# Patient Record
Sex: Female | Born: 1960 | Race: Black or African American | Hispanic: No | Smoking: Never smoker
Health system: Southern US, Community
[De-identification: ages and names within clinical notes are randomized; demographics above are authoritative.]

## PROBLEM LIST (undated history)

## (undated) DIAGNOSIS — I10 Essential (primary) hypertension: Secondary | ICD-10-CM

## (undated) DIAGNOSIS — F32A Depression, unspecified: Secondary | ICD-10-CM

## (undated) DIAGNOSIS — F329 Major depressive disorder, single episode, unspecified: Secondary | ICD-10-CM

## (undated) DIAGNOSIS — E669 Obesity, unspecified: Secondary | ICD-10-CM

## (undated) DIAGNOSIS — E119 Type 2 diabetes mellitus without complications: Secondary | ICD-10-CM

## (undated) HISTORY — DX: Essential (primary) hypertension: I10

## (undated) HISTORY — DX: Type 2 diabetes mellitus without complications: E11.9

---

## 2007-06-15 ENCOUNTER — Emergency Department: Payer: Self-pay | Admitting: Emergency Medicine

## 2008-08-05 ENCOUNTER — Observation Stay: Payer: Self-pay | Admitting: Internal Medicine

## 2010-12-21 ENCOUNTER — Emergency Department: Payer: Self-pay | Admitting: Unknown Physician Specialty

## 2010-12-24 ENCOUNTER — Emergency Department: Payer: Self-pay | Admitting: Emergency Medicine

## 2012-05-03 ENCOUNTER — Ambulatory Visit: Payer: Self-pay

## 2013-03-08 ENCOUNTER — Ambulatory Visit: Payer: Self-pay | Admitting: Internal Medicine

## 2013-03-22 ENCOUNTER — Ambulatory Visit: Payer: Self-pay | Admitting: Vascular Surgery

## 2013-06-09 ENCOUNTER — Ambulatory Visit (INDEPENDENT_AMBULATORY_CARE_PROVIDER_SITE_OTHER): Payer: BC Managed Care – PPO | Admitting: Podiatry

## 2013-06-09 ENCOUNTER — Encounter: Payer: Self-pay | Admitting: Podiatry

## 2013-06-09 VITALS — BP 156/100 | HR 106 | Resp 18 | Ht 65.0 in | Wt 274.0 lb

## 2013-06-09 DIAGNOSIS — L600 Ingrowing nail: Secondary | ICD-10-CM

## 2013-06-09 NOTE — Patient Instructions (Signed)

## 2013-06-09 NOTE — Progress Notes (Signed)
   Subjective:    Patient ID: Lynn [name], female    DOB: 07/11/1960, 53 y.o.   MRN: [id number]  HPI Comments: Took a shower over last weekend and got out of shower and was clipping my toenails and realized that the left big toenail was coming off, do not remember any injury to the toe. It was hurting some yesterday , i took some tylenol and have kept it wrapped up in tape .  Pt is diabetic with the last a1c of 7.2      Review of Systems  Respiratory: Positive for shortness of breath.   Cardiovascular: Positive for leg swelling.  Endocrine:       Diabetic  Musculoskeletal:       Joint pain   Skin:       Change in nails   All other systems reviewed and are negative.       Objective:   Physical Exam        Assessment & Plan:

## 2013-06-11 NOTE — Progress Notes (Signed)
Subjective:     Patient ID: Lynn [name], female   DOB: 10/26/1960, 53 y.o.   MRN: [id number]  HPI patient presents with a severely thickened left hallux nail that is loose and painful on the dorsal surface. States that she cannot cut it and it has just are growing to the side and that is becoming painful   Review of Systems  All other systems reviewed and are negative.       Objective:   Physical Exam  Nursing note and vitals reviewed. Constitutional: She is oriented to person, place, and time.  Cardiovascular: Intact distal pulses.   Musculoskeletal: Normal range of motion.  Neurological: She is oriented to person, place, and time.  Skin: Skin is warm.   neurovascular status is intact with normal muscle strength and no equinus condition noted. Patient's sugar is stable with a A1 C. 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 I have recommended removal of this nail as I think it will be in her best interest and I explained risk of surgery and advantages. Patient wants procedure and today I infiltrated 60 mg Xylocaine Marcaine mixture remove the hallux nail exposed the matrix and applied chemical for applications of phenol followed by alcohol lavaged and sterile dressing instructed on soaks and reappoint as needed

## 2013-06-15 ENCOUNTER — Encounter: Payer: Self-pay | Admitting: Podiatrist

## 2013-06-15 ENCOUNTER — Ambulatory Visit: Payer: BC Managed Care – PPO | Admitting: Podiatrist

## 2013-06-15 VITALS — BP 190/118 | HR 93 | Resp 16 | Ht 64.0 in | Wt 272.0 lb

## 2013-06-15 DIAGNOSIS — L600 Ingrowing nail: Secondary | ICD-10-CM

## 2013-06-15 DIAGNOSIS — M79609 Pain in unspecified limb: Secondary | ICD-10-CM

## 2013-06-15 MED ORDER — CEPHALEXIN 500 MG PO CAPS: 500.0000 mg | ORAL_CAPSULE | Freq: Three times a day (TID) | ORAL | Status: DC

## 2013-06-15 NOTE — Progress Notes (Signed)
Subjective: Patient presents today for followup of left great toe where permanent phenol matrixectomy of the entire toenail was performed courtesy of Dr. Charlsie Merlesegal. Patient states she's been soaking in Epsom salt soaks and that the entire toe became white. She denies any redness or swelling to the toe but states that the whitish discoloration is concerning. she denies any fevers, chills, night sweats or systemic signs of infection.  Objective: Neurovascular status is intact. Left great toe has moisture to the skin on the tip of the toe in plantar aspect of the toe making it appear whitish discoloration due to the maceration. The area where the toenail was removed appears to be healing nicely. The proximal nail fold is pink and uncomfortable with pressure.  Assessment: Status post total permanent phenol matrixectomy left hallux nail  Plan: Recommended the patient's switch to Epsom salts soaks and gave her instructions for these. Instructed her to start leaving the toe open to air to allow the macerated tissue dry out. I also did put her on antibiotics to prevent infection. She will continue to wrap the toe in a gauze type dressing until she is able to utilize a Band-Aid. It does not look infected at today's visit ever if she notices any redness, swelling, pus or increase in pain she is instructed to contact us immediately.

## 2013-06-15 NOTE — Patient Instructions (Signed)
Soak Instructions    Go ahead and switch to epsom salt soaks instead of betadine   Place 1/4 cup of epsom salts in a quart of warm tap water.   soak in the solution for 20 minutes.   Next, remove your foot or feet from solution, blot dry the affected area and leave open to air for an hour.   You may use a band aid large enough to cover the area or use gauze and tape.  Apply other medications to the area as directed by the doctor such as polysporin neosporin.  IF YOUR SKIN BECOMES IRRITATED WHILE USING THESE INSTRUCTIONS, IT IS OKAY TO SWITCH TO  WHITE VINEGAR AND WATER. Or you may use antibacterial soap and water to keep the toe clean

## 2015-06-13 ENCOUNTER — Emergency Department: Payer: BLUE CROSS/BLUE SHIELD

## 2015-06-13 ENCOUNTER — Emergency Department
Admission: EM | Admit: 2015-06-13 | Discharge: 2015-06-13 | Discharge status: Against Medical Advice | Payer: BLUE CROSS/BLUE SHIELD | Attending: Emergency Medicine | Admitting: Emergency Medicine

## 2015-06-13 ENCOUNTER — Encounter: Payer: Self-pay | Admitting: *Deleted

## 2015-06-13 DIAGNOSIS — E119 Type 2 diabetes mellitus without complications: Secondary | ICD-10-CM | POA: Diagnosis not present

## 2015-06-13 DIAGNOSIS — R202 Paresthesia of skin: Secondary | ICD-10-CM | POA: Diagnosis not present

## 2015-06-13 DIAGNOSIS — R9431 Abnormal electrocardiogram [ECG] [EKG]: Secondary | ICD-10-CM | POA: Diagnosis not present

## 2015-06-13 DIAGNOSIS — I10 Essential (primary) hypertension: Secondary | ICD-10-CM | POA: Diagnosis not present

## 2015-06-13 LAB — BASIC METABOLIC PANEL
Anion gap: 7 (ref 5–15)
BUN: 13 mg/dL (ref 6–20)
CHLORIDE: 105 mmol/L (ref 101–111)
CO2: 28 mmol/L (ref 22–32)
CREATININE: 0.8 mg/dL (ref 0.44–1.00)
Calcium: 9.7 mg/dL (ref 8.9–10.3)
GFR calc Af Amer: >60 mL/min (ref >60)
GFR calc non Af Amer: >60 mL/min (ref >60)
GLUCOSE: 156 mg/dL — AB (ref 65–99)
Potassium: 3.8 mmol/L (ref 3.5–5.1)
SODIUM: 140 mmol/L (ref 135–145)

## 2015-06-13 LAB — CBC
HCT: 42 % (ref 35.0–47.0)
Hemoglobin: 13.6 g/dL (ref 12.0–16.0)
MCH: 29.4 pg (ref 26.0–34.0)
MCHC: 32.5 g/dL (ref 32.0–36.0)
MCV: 90.5 fL (ref 80.0–100.0)
PLATELETS: 218 10*3/uL (ref 150–440)
RBC: 4.64 MIL/uL (ref 3.80–5.20)
RDW: 14.5 % (ref 11.5–14.5)
WBC: 8.5 10*3/uL (ref 3.6–11.0)

## 2015-06-13 NOTE — ED Notes (Signed)
Pt not answering to name being called, not visible in waiting room.

## 2015-06-13 NOTE — ED Notes (Signed)
Pt called to room without answer, not visible in waiting room.

## 2015-06-13 NOTE — ED Notes (Signed)
Pt arrives from PCP office for abnormal EKG, pt arrives with no complaints of chest pain or SOB, states she has been out of her BP for 7 days and states burning in her arms and neck only when she lies down, pt awake and alert in no acute distress

## 2015-06-13 NOTE — ED Notes (Signed)
Pt called X 2. Not visible in waiting room

## 2017-04-14 ENCOUNTER — Ambulatory Visit: Payer: BLUE CROSS/BLUE SHIELD

## 2017-04-28 ENCOUNTER — Ambulatory Visit: Payer: Self-pay

## 2017-06-30 ENCOUNTER — Ambulatory Visit: Payer: Self-pay | Attending: Oncology

## 2017-11-02 ENCOUNTER — Encounter: Payer: Self-pay | Admitting: Emergency Medicine

## 2017-11-02 ENCOUNTER — Emergency Department: Payer: Medicaid Other

## 2017-11-02 ENCOUNTER — Other Ambulatory Visit: Payer: Self-pay

## 2017-11-02 ENCOUNTER — Emergency Department
Admission: EM | Admit: 2017-11-02 | Discharge: 2017-11-02 | Disposition: A | Discharge status: Home / Self Care | Payer: Medicaid Other | Attending: Emergency Medicine | Admitting: Emergency Medicine

## 2017-11-02 DIAGNOSIS — I10 Essential (primary) hypertension: Secondary | ICD-10-CM | POA: Insufficient documentation

## 2017-11-02 DIAGNOSIS — F329 Major depressive disorder, single episode, unspecified: Secondary | ICD-10-CM | POA: Insufficient documentation

## 2017-11-02 DIAGNOSIS — F322 Major depressive disorder, single episode, severe without psychotic features: Secondary | ICD-10-CM

## 2017-11-02 DIAGNOSIS — Z79899 Other long term (current) drug therapy: Secondary | ICD-10-CM | POA: Insufficient documentation

## 2017-11-02 DIAGNOSIS — Z7984 Long term (current) use of oral hypoglycemic drugs: Secondary | ICD-10-CM | POA: Insufficient documentation

## 2017-11-02 DIAGNOSIS — E119 Type 2 diabetes mellitus without complications: Secondary | ICD-10-CM | POA: Diagnosis not present

## 2017-11-02 DIAGNOSIS — R440 Auditory hallucinations: Secondary | ICD-10-CM | POA: Insufficient documentation

## 2017-11-02 HISTORY — DX: Depression, unspecified: F32.A

## 2017-11-02 HISTORY — DX: Major depressive disorder, single episode, unspecified: F32.9

## 2017-11-02 LAB — URINALYSIS, ROUTINE W REFLEX MICROSCOPIC
BILIRUBIN URINE: NEGATIVE
Glucose, UA: NEGATIVE mg/dL
Hgb urine dipstick: NEGATIVE
Ketones, ur: NEGATIVE mg/dL
Leukocytes, UA: NEGATIVE
NITRITE: NEGATIVE
Protein, ur: NEGATIVE mg/dL
SPECIFIC GRAVITY, URINE: 1.019 (ref 1.005–1.030)
pH: 5 (ref 5.0–8.0)

## 2017-11-02 LAB — COMPREHENSIVE METABOLIC PANEL
ALT: 24 U/L (ref 14–54)
AST: 18 U/L (ref 15–41)
Albumin: 4.5 g/dL (ref 3.5–5.0)
Alkaline Phosphatase: 64 U/L (ref 38–126)
Anion gap: 11 (ref 5–15)
BUN: 19 mg/dL (ref 6–20)
CHLORIDE: 103 mmol/L (ref 101–111)
CO2: 26 mmol/L (ref 22–32)
CREATININE: 1.15 mg/dL — AB (ref 0.44–1.00)
Calcium: 9.8 mg/dL (ref 8.9–10.3)
GFR calc Af Amer: >60 mL/min (ref >60)
GFR calc non Af Amer: 53 mL/min — ABNORMAL LOW (ref >60)
Glucose, Bld: 117 mg/dL — ABNORMAL HIGH (ref 65–99)
Potassium: 4 mmol/L (ref 3.5–5.1)
SODIUM: 140 mmol/L (ref 135–145)
Total Bilirubin: 0.5 mg/dL (ref 0.3–1.2)
Total Protein: 8.4 g/dL — ABNORMAL HIGH (ref 6.5–8.1)

## 2017-11-02 LAB — CBC
HCT: 36.2 % (ref 35.0–47.0)
HEMOGLOBIN: 12.2 g/dL (ref 12.0–16.0)
MCH: 31.9 pg (ref 26.0–34.0)
MCHC: 33.7 g/dL (ref 32.0–36.0)
MCV: 94.7 fL (ref 80.0–100.0)
Platelets: 209 10*3/uL (ref 150–440)
RBC: 3.82 MIL/uL (ref 3.80–5.20)
RDW: 14.2 % (ref 11.5–14.5)
WBC: 4.6 10*3/uL (ref 3.6–11.0)

## 2017-11-02 LAB — URINE DRUG SCREEN, QUALITATIVE (ARMC ONLY)
Amphetamines, Ur Screen: NOT DETECTED
Barbiturates, Ur Screen: NOT DETECTED
Benzodiazepine, Ur Scrn: NOT DETECTED
CANNABINOID 50 NG, UR ~~LOC~~: NOT DETECTED
Cocaine Metabolite,Ur ~~LOC~~: NOT DETECTED
MDMA (ECSTASY) UR SCREEN: NOT DETECTED
Methadone Scn, Ur: NOT DETECTED
Opiate, Ur Screen: NOT DETECTED
PHENCYCLIDINE (PCP) UR S: NOT DETECTED
Tricyclic, Ur Screen: NOT DETECTED

## 2017-11-02 LAB — SALICYLATE LEVEL: Salicylate Lvl: <7 mg/dL (ref 2.8–30.0)

## 2017-11-02 LAB — ETHANOL: Alcohol, Ethyl (B): <10 mg/dL (ref <10)

## 2017-11-02 LAB — ACETAMINOPHEN LEVEL: Acetaminophen (Tylenol), Serum: <10 ug/mL — ABNORMAL LOW (ref 10–30)

## 2017-11-02 MED ORDER — FLUOXETINE HCL 20 MG PO CAPS: 20.0000 mg | ORAL_CAPSULE | Freq: Every day | ORAL | 2 refills | Status: DC

## 2017-11-02 NOTE — Discharge Instructions (Signed)

## 2017-11-02 NOTE — ED Notes (Addendum)
Pt dressed out into appropriate behavioral health clothing. Pt belongings consist of a pair of white earrings, a purple/black watch, a set of keys, a white wallet, a cell phone, white tennis shoes, black socks, two black hair ties, a black shirt, black bra and black/white panties. Pt has seven $20 bills ($140), two [employer] cards, an EBT card and two social security cards that was given to [employer] and sent to the hospital safe.

## 2017-11-02 NOTE — ED Notes (Signed)
Dr. Toni Amendlapacs is currently at patients bedside

## 2017-11-02 NOTE — ED Notes (Signed)
1/1 bags of belongings and 1 envelope from the safe returned to her and she verbalized that she has received back all belongings that she came here with
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
## 2017-11-02 NOTE — ED Provider Notes (Signed)
[facility] Emergency Department Provider Note   ____________________________________________   First MD Initiated Contact with Patient 11/02/17 1510     (approximate)  I have reviewed the triage vital signs and the nursing notes.   HISTORY  Chief Complaint Hallucinations    HPI Lynn [name] is a 57 y.o. female history of diabetes and hypertension  Patient reports she had a routine follow-up with her doctor, she was sent for evaluation because of voices she has been hearing recently  Reports she has voices that tell her that she is going to die soon.  She denies that they give her any commands.  She does not have any desire to harm herself or anyone else  She reports she is never had these symptoms in the past except he started in the evenings in about February.  She did 1 points thought someone of broken to the house but evidently no one had.  Denies any active symptoms, but reports she hears voices mostly at night.  She was recently divorced, lost her home in the last year and reports she is been under a lot of stress and sadness.  Denies any desire to harm herself, no desire to hurt anyone else.  She comes voluntarily   Past Medical History:  Diagnosis Date  . Depression   . Diabetes mellitus without complication (HCC)   . Hypertension     There are no active problems to display for this patient.   History reviewed. No pertinent surgical history.  Prior to Admission medications   Medication Sig Start Date End Date Taking? Authorizing Provider  amLODipine (NORVASC) 10 MG tablet Take 10 mg by mouth daily.    [provider]  cephALEXin (KEFLEX) 500 MG capsule Take 1 capsule (500 mg total) by mouth 3 (three) times daily. 06/15/13   Delories Heinz, DPM  cloNIDine (CATAPRES) 0.3 MG tablet Take 0.3 mg by mouth 2 (two) times daily.    [provider]  FLUoxetine (PROZAC) 20 MG capsule Take 1 capsule (20 mg total) by mouth  daily. 11/02/17 01/31/18  Clapacs, Jackquline Denmark, MD  hydrALAZINE (APRESOLINE) 25 MG tablet  06/03/13   [provider]  hydrochlorothiazide (HYDRODIURIL) 25 MG tablet Take 25 mg by mouth daily.    [provider]  lisinopril (PRINIVIL,ZESTRIL) 20 MG tablet Take 20 mg by mouth daily.    [provider]  metFORMIN (GLUCOPHAGE-XR) 500 MG 24 hr tablet  06/03/13   [provider]    Allergies Patient has no known allergies.  History reviewed. No pertinent family history.  Social History Social History   Tobacco Use  . Smoking status: Never Smoker  . Smokeless tobacco: Never Used  Substance Use Topics  . Alcohol use: No  . Drug use: No    Review of Systems Constitutional: No fever/chills Eyes: No visual changes. ENT: No sore throat. Cardiovascular: Denies chest pain. Respiratory: Denies shortness of breath. Gastrointestinal: No abdominal pain.  No nausea, no vomiting.  No diarrhea.  No constipation. Genitourinary: Negative for dysuria. Musculoskeletal: Negative for back pain. Skin: Negative for rash. Neurological: Negative for headaches, focal weakness or numbness.    ____________________________________________   PHYSICAL EXAM:  VITAL SIGNS: ED Triage Vitals  Enc Vitals Group     BP 11/02/17 1416 (!) 160/89     Pulse Rate 11/02/17 1416 (!) 57     Resp 11/02/17 1416 16     Temp 11/02/17 1416 98.9 F (37.2 C)  Temp Source 11/02/17 1416 Oral     SpO2 11/02/17 1416 99 %     Weight 11/02/17 1415 272 lb (123.4 kg)     Height 11/02/17 1415 5\' 4"  (1.626 m)     Head Circumference --      Peak Flow --      Pain Score 11/02/17 1420 7     Pain Loc --      Pain Edu? --      Excl. in GC? --     Constitutional: Alert and oriented. Well appearing and in no acute distress.  She is somewhat tearful, she reports that she is scared because she does not think she needs to be in a psychiatric hospital Eyes: Conjunctivae are normal. Head:  Atraumatic. Nose: No congestion/rhinnorhea. Mouth/Throat: Mucous membranes are moist. Neck: No stridor.   Cardiovascular: Normal rate, regular rhythm. Grossly normal heart sounds.  Good peripheral circulation. Respiratory: Normal respiratory effort.  No retractions. Lungs CTAB. Musculoskeletal: Ambulates with normal gait.  Normal strength in all extremities grossly. Neurologic:  Normal speech and language. No gross focal neurologic deficits are appreciated.  Skin:  Skin is warm, dry and intact. No rash noted. Psychiatric: Mood and affect are slightly anxious and somewhat tearful.  Does not appear to be responding to any internal stimuli. Speech and behavior are normal.  Denies desire to harm herself or anyone else.  ____________________________________________   LABS (all labs ordered are listed, but only abnormal results are displayed)  Labs Reviewed  COMPREHENSIVE METABOLIC PANEL - Abnormal; Notable for the following components:      Result Value   Glucose, Bld 117 (*)    Creatinine, Ser 1.15 (*)    Total Protein 8.4 (*)    GFR calc non Af Amer 53 (*)    All other components within normal limits  ACETAMINOPHEN LEVEL - Abnormal; Notable for the following components:   Acetaminophen (Tylenol), Serum <10 (*)    All other components within normal limits  URINALYSIS, ROUTINE W REFLEX MICROSCOPIC - Abnormal; Notable for the following components:   Color, Urine YELLOW (*)    APPearance CLEAR (*)    All other components within normal limits  ETHANOL  SALICYLATE LEVEL  CBC  URINE DRUG SCREEN, QUALITATIVE ([facility] ONLY)   ____________________________________________  EKG   ____________________________________________  RADIOLOGY  CT head reviewed, negative for acute ____________________________________________   PROCEDURES  Procedure(s) performed: None  Procedures  Critical Care performed: No  ____________________________________________   INITIAL IMPRESSION /  ASSESSMENT AND PLAN / ED COURSE  Pertinent labs & imaging results that were available during my care of the patient were reviewed by me and considered in my medical decision making (see chart for details).  Patient presents for evaluation of hearing voices.  Obtaining history and obtaining psychiatric consult it appears this is likely due to major depression which is come about with recent large life changes in the past year.  She does not on my examination to be appear to be anything other than voluntary.  She does not appear to be at risk of harming herself or on else.  Her lab work and CT the head are reassuring.  She denies active acute medical illness.  ----------------------------------------- 4:41 PM on 11/02/2017 -----------------------------------------  Discussed case with Dr. Toni Amendlapacs, he is providing her a prescription for antidepressant and recommends close outpatient follow-up with her primary care doctor.  Return precautions and treatment recommendations and follow-up discussed with the patient who is agreeable with the plan.  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 Qty: 30 capsule, Refills: 2         Note:  This document was prepared using Dragon voice recognition software and may include unintentional dictation errors.     Sharyn Creamer, MD 11/02/17 [phone]

## 2017-11-02 NOTE — ED Notes (Signed)
Discussed with Dr. Roxan Hockeyobinson, proceed with psychiatric protocols.

## 2017-11-02 NOTE — ED Triage Notes (Addendum)
Pt arrived via POV, pt states she drove herself here.  Per first nurse, pt has hx of type II diabetes, HTN, obesity and depression. Pt has had auditory hallucinations since February. Pt lives with daughter.  Pt was sent here by PCP, pt was at PCP's office for follow up visit, pt reports auditory hallucinations.  Pt states the voices state "i'm not going to be here long" Pt states she does not recognize the voice.    Pt had episode in April where she thought there was an intruder in her house that was trying to kill her.  Pt was living and currently living with daughter and her children.

## 2018-06-15 ENCOUNTER — Encounter (HOSPITAL_COMMUNITY): Payer: Self-pay | Admitting: Psychiatry

## 2018-06-15 ENCOUNTER — Ambulatory Visit (INDEPENDENT_AMBULATORY_CARE_PROVIDER_SITE_OTHER): Payer: Medicaid Other | Admitting: Psychiatry

## 2018-06-15 DIAGNOSIS — F323 Major depressive disorder, single episode, severe with psychotic features: Secondary | ICD-10-CM
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
## 2018-06-21 ENCOUNTER — Other Ambulatory Visit: Payer: Self-pay | Admitting: Family

## 2018-06-21 DIAGNOSIS — Z1231 Encounter for screening mammogram for malignant neoplasm of breast: Secondary | ICD-10-CM

## 2018-06-22 ENCOUNTER — Ambulatory Visit (INDEPENDENT_AMBULATORY_CARE_PROVIDER_SITE_OTHER): Payer: Medicaid Other | Admitting: Psychiatry

## 2018-06-22 ENCOUNTER — Encounter (HOSPITAL_COMMUNITY): Payer: Self-pay | Admitting: Psychiatry

## 2018-06-22 DIAGNOSIS — F331 Major depressive disorder, recurrent, moderate: Secondary | ICD-10-CM

## 2018-06-22 NOTE — Progress Notes (Signed)
Date: 06/22/18 Time: 1:02-1:59pm Type of Therapy: Individual Therapy  Treatment Goals Addressed: Exploring interests and regaining independence as evidenced by processing unhelpful thoughts and feelings that are barriers to taking steps towards independence and accessing appropriate resources.  Interventions: Motivational Interviewing, CBT, Role Playing, thought challenging, sharing of community resources  Summary: Client Lynn [name] is a 58 y.o. woman who presents with Major Depressive Disorder.  Suicidal/Homicidal: No, without intent/plan  Therapist Response:  [name] met with counselor for individual therapy. Counselor greeted and joined with client to begin therapy. [name] discussed her psychiatric symptoms and current life events. [name]  reports being hopeful of new opportunities after the intake appoinment, but had challenges in contacting the resources, due to an overwhelming fear of rejection by the agencies. Counselor processes the concept and history of rejection for her. She expounded on two major life events in the past 6 years based in rejection that have impacted her current functioning. We explored times in life where she's been accepted, challenging automatic thoughts. We role played her calling the agencies and visualizing driving to their sites, and coping skills to make those encounters successful. She reported being nervous, but willing to attempt making contact to gather more information from them. [name] has a plan to visit at least the [facility] between now and next session. Counselor praised the client for her work in session and encouraged her desire to make baby steps in the process.  Plan: To return again in 1 week.  Diagnosis:?????Axis I: Major Depression, recurrent  Lise Auer, LCSW

## 2018-06-29 ENCOUNTER — Ambulatory Visit (INDEPENDENT_AMBULATORY_CARE_PROVIDER_SITE_OTHER): Payer: Medicaid Other | Admitting: Psychiatry

## 2018-06-29 DIAGNOSIS — F331 Major depressive disorder, recurrent, moderate: Secondary | ICD-10-CM | POA: Diagnosis not present

## 2018-06-30 ENCOUNTER — Encounter (HOSPITAL_COMMUNITY): Payer: Self-pay | Admitting: Psychiatry

## 2018-06-30 NOTE — Progress Notes (Signed)
Client: Desha Gawthrop  Date: 06/29/18  Time: 2:40-3:38 pm  Type of Therapy: Individual Therapy  Diagnosis:?Axis I: Major Depressive Disorder, moderate, recurrent  Treatment goals addressed: I want to work on finding my interests and independence again.  Interventions: CBT, Motivational Interviewing, Psychoeducation, Coping Skill Building, Resource linkage, Communication Skill Building  Summary: Client Jorgia, 58yo female who presents with Major Depressive Disorder, due to family conflict and housing situation. Counselor using therapeutic interventions to address depressive symptoms and to prepare for her transition to new housing and daily routine.  Therapist Response: Aalyssa met with Counselor for individual therapy. Counselor joined with Naydeline as she shared about family issues, contacting [facility], going out into the community and attending church for the first time in years. Counselor assessed current psychiatric symptoms and life stressors with the Nira. She reported that overall her depression is starting to lift, however on Monday she cried almost all day and stayed in her room. This was sparked by thoughts about her worth, rejections, and relationship with her daughter. She was able to identify that she is taking her medications as prescribed. Counselor prompted Khaliyah to process her cognitive coping, as Counselor provided psychoeducation on [facility] [place]. Clinician praised Allien for her bravery in reaching out to [facility], taking her grandchildren to the [place] and choosing a church to attend on Sunday. Solenne was visibly proud of herself and simultaneously attempting to downplay her accomplishments. Counselor prompted the client to share 3 things good about herself and challenged her at home to try using gratefulness statements. Counselor shared about a [facility] and the [facility], where she may be able to get connected with others to gain a stronger  support system. Nachelle is excited and willing to check it out. She closed by sharing about an upcoming oral surgery she has coming up in Feb and her anxieties around that.  Suicidal/Homicidal: No current safety concerns. No plan/intent to harm self or others.  Plan: To return in 1 week. Will apply skills learned in session at home, until next session.  ?   , LCSW   

## 2018-07-04 ENCOUNTER — Ambulatory Visit (INDEPENDENT_AMBULATORY_CARE_PROVIDER_SITE_OTHER): Payer: Medicaid Other | Admitting: Psychiatry

## 2018-07-04 ENCOUNTER — Encounter (HOSPITAL_COMMUNITY): Payer: Self-pay | Admitting: Psychiatry

## 2018-07-04 DIAGNOSIS — F331 Major depressive disorder, recurrent, moderate: Secondary | ICD-10-CM

## 2018-07-04 NOTE — Progress Notes (Signed)
Client: Lynn [name]  Date: 07/04/18  Time: 9:06-10:00am  Type of Therapy: Individual Therapy  Diagnosis:?Axis I: Major Depressive Disorder, moderate, recurrent  Treatment goals addressed: I want to work on finding my interests and independence again to decrease depressive symptoms.  Interventions: CBT, Motivational Interviewing, Psychoeducation, Games developer, Resource linkage  Summary: Client Lynn [name], 58yo female who presents with Major Depressive Disorder, due to family conflict and housing situation. Counselor using therapeutic interventions to address depressive symptoms and to prepare for her transition to new housing and daily routine.  Therapist Response: Lynn [name] met with Counselor for individual therapy. Counselor joined with Lynn [name] as she shared about information gathered at 2 appointments regarding income and housing and how it impacted her emotionally. Counselor assessed current psychiatric symptoms and life stressors with Lynn [name]. She reported that she had a good meeting with [facility] because they gave her information on next steps, but that she felt discouraged because she thought the process may be a little easier. We processed how she coped with her feelings. She reported that she did not get as depressed about it as she would have in the past. She is concerned about her eye sight (impacted by diabetes) and how it may impact her ability to complete her paperwork. We discussed ways to address her "stuck/fixed" thinking, brainstorming a variety of potential solutions in addressing this problem, along with other "stuck spots" she presented during session. She reported her mood improving from the beginning of session to the end of session. Counselor assigned homework for the client to continue stating positive affirmations of gratitude and to challenge her "stuck/fixed" thinking to more solution-focused thoughts. 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 For self-care she will make an eye doctor appointment to address her vision concerns.  Suicidal/Homicidal: No current safety concerns. No plan/intent to harm self or others.  Plan: To return in 1 week. Will apply skills learned in session at home and complete homework, until next session.  ?  Lise Auer, LCSW

## 2018-07-11 ENCOUNTER — Ambulatory Visit (INDEPENDENT_AMBULATORY_CARE_PROVIDER_SITE_OTHER): Payer: Medicaid Other | Admitting: Psychiatry

## 2018-07-11 ENCOUNTER — Encounter (HOSPITAL_COMMUNITY): Payer: Self-pay | Admitting: Psychiatry

## 2018-07-11 DIAGNOSIS — F331 Major depressive disorder, recurrent, moderate: Secondary | ICD-10-CM | POA: Diagnosis not present

## 2018-07-11 NOTE — Progress Notes (Signed)
Client: Lynn [name]  Date: 07/11/18  Time: 8:55-9:54am  Type of Therapy: Individual Therapy  Diagnosis:?Axis I: Major Depressive Disorder, moderate, recurrent  Treatment goals addressed: I want to work on finding my interests and independence again to decrease depressive symptoms.  Interventions: CBT, Motivational Interviewing, Solution-Focused Approach, Strengths-based approach, Games developer, Resource linkage  Summary: Client [place], 58yo female who presents with Major Depressive Disorder, due to family conflict and housing situation. Counselor using therapeutic interventions to address depressive symptoms and to prepare for her transition to new housing and daily routine.  Therapist Response: Lynn [name] met with Counselor for individual therapy. Counselor joined with Lynn [name] as she shared about progress on life plan, accessing resources, and family conflicts. Counselor assessed current psychiatric symptoms and life stressors with Lynn [name]. Lynn [name] presented as more anxious than normal, with her legs bouncing and shaking/eyes avoidant, until she was able to share some of her concerns. She chose to stay in more this week than planned due to depressive symptoms and the urge to isolate. Issues between her and a family member are escalating, causing her to become increasingly more focused on her efforts to change housing. She reports ongoing fears of being rejected from agencies and recourses, stemming for many rejections in her past. Counselor prompted Lynn [name] to thought challenge, use the STOP technique, and reframe negative cognitions. Counselor and Lynn [name] processed healthy coping skills and steps to take between now and next session. Counselor provided Lynn [name] with resources to contact and research for homework this week. She plans to meet with Primary Doctor tomorrow, attend a support group Wednesday, turn in disability paperwork Tuesday, [facility] the [facility] to volunteer and to attend church  on Sunday as part of her self-care plan.  Suicidal/Homicidal: No current safety concerns, however she reported feeling like "life is dead, dull and lonely, but stated her belief system prevents her from wanting to hurt herself or end her life. No plan/intent to harm self or others.  Plan: To return in 1 week. Will apply skills learned in session at home and complete homework, until next session.  ?  Lise Auer, LCSW

## 2018-07-18 ENCOUNTER — Encounter (HOSPITAL_COMMUNITY): Payer: Self-pay | Admitting: Psychiatry

## 2018-07-18 ENCOUNTER — Ambulatory Visit (INDEPENDENT_AMBULATORY_CARE_PROVIDER_SITE_OTHER): Payer: Medicaid Other | Admitting: Psychiatry

## 2018-07-18 DIAGNOSIS — F331 Major depressive disorder, recurrent, moderate: Secondary | ICD-10-CM | POA: Diagnosis not present

## 2018-07-18 NOTE — Progress Notes (Signed)
Client: Esmee Fallaw  Date: 07/18/18  Time: 9:01-9:56am  Type of Therapy: Individual Therapy  Diagnosis:?Axis I: Major Depressive Disorder, moderate, recurrent  Treatment goals addressed: I want to work on finding my interests and independence again to decrease depressive symptoms.  Interventions: CBT, Motivational Interviewing, Solution-Focused Approach, Strengths-based approach, Games developer, Resource linkage  Summary: Client [place], 58yo female who presents with Major Depressive Disorder, due to family conflict and housing situation. Counselor using therapeutic interventions to address depressive symptoms and to prepare for her transition to new housing and daily routine.  Therapist Response: [name] met with Counselor for individual therapy. Counselor joined with [name] as she shared about positive coping skill utilization, an upcoming surgery, relationship issues, and future plans. Counselor assessed current psychiatric symptoms and life stressors with [name]. Today Lorilee presented as happy, more energetic and optimistic. She denied any problematic depressive symptoms over the past week. Counselor prompted Sharrie to share about healthy coping skills that have helped her achieve a positive outlook. She reported animal therapy, cognitive coping skills, and starting to envision an improved self, post oral surgery. Counselor praised her for [employer] from session into practice and we explored how oral surgery would be a positive outcome for her. Counselor processed some issues within her family or origin and current living situation. Counselor continues to encourage Clio to seek ways to expand her support system. Lashawnna is open to this, but post-surgery when she will have her confidence up more.  Suicidal/Homicidal: No current safety concerns. Feels more hopeful about living this week. No plan/intent to harm self or others.  Plan: To return in 1 week. Will apply skills learned in  session at home and complete homework, until next session.  ?  Lise Auer, LCSW

## 2018-07-25 ENCOUNTER — Ambulatory Visit (INDEPENDENT_AMBULATORY_CARE_PROVIDER_SITE_OTHER): Payer: Medicaid Other | Admitting: Psychiatry

## 2018-07-25 ENCOUNTER — Encounter (HOSPITAL_COMMUNITY): Payer: Self-pay | Admitting: Psychiatry

## 2018-07-25 DIAGNOSIS — F331 Major depressive disorder, recurrent, moderate: Secondary | ICD-10-CM

## 2018-07-25 NOTE — Progress Notes (Signed)
Client: Lynn [name]  Date: 07/25/18  Time: 8:58-9:55am  Type of Therapy: Individual Therapy  Diagnosis:?Axis I: Major Depressive Disorder, moderate, recurrent  Treatment goals addressed: I want to work on finding my interests and independence again to decrease depressive symptoms.  Interventions: CBT, Motivational Interviewing, Solution-Focused Approach, Strengths-based approach, Games developer, Resource linkage  Summary: Client Lynn [name], 58yo female who presents with Major Depressive Disorder, due to family conflict and housing situation. Counselor using therapeutic interventions to address depressive symptoms and to prepare for her transition to new housing and daily routine.  Therapist Response: Lynn [name] met with Counselor for individual therapy. Counselor joined with Lynn [name] as she shared about progress on long-term goal of moving, her upcoming surgery, self-care and physical health impacting mental health. Counselor assessed current psychiatric symptoms and life stressors with Lynn [name]. Counselor identified that Lynn [name] presented as calm and peaceful today. She reported that this past week she has experienced more anxiety than normal with feelings that her progress if moving too slowly. Counselor engaged Lynn [name] in Occupational psychologist and automatic thought stopping. [name] reported that this helped her to expand her perspective to see the bigger picture. Lynn [name] was able to identify numerous ways in which her situation has improved in the past 2 months. She shared about the status of her benefits, housing, and medical procedures. We discussed a routine, reminders, and increasing her support system to decrease anxious feelings. Lynn [name] shared about upcoming birthday plans and how she will implement self-care this week. Next week we plan to discuss working on relationship with daughter for healing.  Suicidal/Homicidal: No current safety concerns. Feels more hopeful about living this week. No  plan/intent to harm self or others.  Plan: To return in 1 week. Will apply skills learned in session at home and complete homework, until next session.  ?  Lise Auer, LCSW

## 2018-07-28 ENCOUNTER — Ambulatory Visit (INDEPENDENT_AMBULATORY_CARE_PROVIDER_SITE_OTHER): Payer: Medicaid Other | Admitting: Psychiatry

## 2018-07-28 ENCOUNTER — Encounter (HOSPITAL_COMMUNITY): Payer: Self-pay | Admitting: Psychiatry

## 2018-07-28 VITALS — BP 163/80 | HR 83 | Ht 65.0 in | Wt 268.0 lb

## 2018-07-28 DIAGNOSIS — F331 Major depressive disorder, recurrent, moderate: Secondary | ICD-10-CM | POA: Diagnosis not present

## 2018-07-28 DIAGNOSIS — F4312 Post-traumatic stress disorder, chronic: Secondary | ICD-10-CM

## 2018-07-28 MED ORDER — TRAZODONE HCL 100 MG PO TABS: ORAL_TABLET | ORAL | 0 refills | Status: DC

## 2018-07-28 MED ORDER — LURASIDONE HCL 40 MG PO TABS: 40.0000 mg | ORAL_TABLET | Freq: Every day | ORAL | 1 refills | Status: DC
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
## 2018-08-08 ENCOUNTER — Encounter (HOSPITAL_COMMUNITY): Payer: Self-pay | Admitting: Psychiatry

## 2018-08-08 ENCOUNTER — Ambulatory Visit (INDEPENDENT_AMBULATORY_CARE_PROVIDER_SITE_OTHER): Payer: Medicaid Other | Admitting: Psychiatry

## 2018-08-08 DIAGNOSIS — F331 Major depressive disorder, recurrent, moderate: Secondary | ICD-10-CM | POA: Diagnosis not present

## 2018-08-08 DIAGNOSIS — F4312 Post-traumatic stress disorder, chronic: Secondary | ICD-10-CM

## 2018-08-08 NOTE — Progress Notes (Signed)
Client: Lynn [name]  Date: 08/08/18  Time: 9:00-9:57am  Type of Therapy: Individual Therapy  Diagnosis:?Axis I: Major Depressive Disorder, moderate, recurrent  Treatment goals addressed: I want to work on finding my interests and independence again to decrease depressive symptoms.  Interventions: CBT, Motivational Interviewing, Solution-Focused Approach, Strengths-based approach, Games developer, Resource linkage  Summary: [name], 58yo female who presents with Major Depressive Disorder, due to family conflict and housing situation. Counselor using therapeutic interventions to address depressive symptoms and to prepare for her transition to new housing and daily routine.  Therapist Response: Lynn [name] met with Counselor for individual therapy. Counselor joined with Lynn [name] as she shared about her birthday, results from a recent EKG, progress on housing, progress on her dental surgery and current coping skills. Counselor assessed current psychiatric symptoms and life stressors with Lynn [name]. Alondria presented as energetic, enthusiastic and optimistic today. She reported that she believes her depression has improved, but that last month she experienced a visual hallucination of a shadowed female figure in her room. Counselor processed an anxiety provoking situation with Lynn [name], and was able to praise her for using her healthy coping skills to endure the situation. Counselor encouraged Lynn [name] in her efforts towards her housing and dental surgery. Counselor highlighted visible progress from when Lynn [name] started treatment to her positive outlook on her situation. Counselor prompted Lynn [name] to self-reflect on how she has caused this marked improvement. Counselor reviewed psychoeducation on cognitive coping. Counselor challenged Lynn [name] to connect with a local resource to further her improvements on social skills and connection to resources to enlarge her support system.  Suicidal/Homicidal: No current  safety concerns. Feels more hopeful about living this week. No plan/intent to harm self or others.  Plan: To return in 1 week. Will apply skills learned in session at home and complete homework, until next session.  ?  Lise Auer, LCSW

## 2018-08-15 ENCOUNTER — Other Ambulatory Visit: Payer: Self-pay

## 2018-08-15 ENCOUNTER — Ambulatory Visit (INDEPENDENT_AMBULATORY_CARE_PROVIDER_SITE_OTHER): Payer: Medicaid Other | Admitting: Psychiatry

## 2018-08-15 ENCOUNTER — Encounter (HOSPITAL_COMMUNITY): Payer: Self-pay | Admitting: Psychiatry

## 2018-08-15 DIAGNOSIS — F331 Major depressive disorder, recurrent, moderate: Secondary | ICD-10-CM | POA: Diagnosis not present

## 2018-08-15 DIAGNOSIS — F4312 Post-traumatic stress disorder, chronic: Secondary | ICD-10-CM
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
## 2018-08-22 ENCOUNTER — Ambulatory Visit (HOSPITAL_COMMUNITY): Payer: Medicaid Other | Admitting: Psychiatry

## 2018-08-29 ENCOUNTER — Encounter (HOSPITAL_COMMUNITY): Payer: Self-pay | Admitting: Psychiatry

## 2018-08-29 ENCOUNTER — Ambulatory Visit (INDEPENDENT_AMBULATORY_CARE_PROVIDER_SITE_OTHER): Payer: Medicaid Other | Admitting: Psychiatry

## 2018-08-29 ENCOUNTER — Other Ambulatory Visit: Payer: Self-pay

## 2018-08-29 DIAGNOSIS — F331 Major depressive disorder, recurrent, moderate: Secondary | ICD-10-CM

## 2018-08-29 DIAGNOSIS — F4312 Post-traumatic stress disorder, chronic: Secondary | ICD-10-CM

## 2018-08-29 NOTE — Progress Notes (Signed)
Virtual Visit via Telephone Note  I connected with Lynn [name] on 08/29/18 at  9:00 AM EDT by telephone and verified that I am speaking with the correct person using two identifiers.   I discussed the limitations, risks, security and privacy concerns of performing an evaluation and management service by telephone and the availability of in person appointments. I also discussed with the patient that there may be a patient responsible charge related to this service. The patient expressed understanding and agreed to proceed.   History of Present Illness: Moderate episode of recurrent major depressive disorder and Chronic Post-traumatic Stress Disorder due to extreme family distress.   Observations/Objective: Lynn [name] began the session expressing anxious and depressive thoughts over the past few weeks related to her recent procedure and COVID-19. Lynn [name] was able to identified ways that she has utilized her coping skills to combat those unhelpful thoughts. She is relying on her faith to maintain mental health and to have a positive outlook on the uncertain situations at hand. Counselor assessed daily functioning and activities. Lynn [name] denies suicidal ideations. Lynn [name] expressed concerns about her medications and diabetes care. Counselor encouraged Lynn [name] to contact and communicate with her pharmacist to address issues and be preventative with ordering her medications. Counselor praised Lynn [name] for her desire to share her healthy coping skills with others and for the perspective she has on isolation and loneliness. Counselor encouraged Lynn [name] to continue work on her goal of finding housing and gaining independence. Counselor and Lynn [name] discussed resources she can connect with.   Assessment and Plan: Counselor assessed that Lynn [name] is stable and connected with her support system at this time. She understands the importance of utilizing healthy coping skills and expressing her needs. Counselor  discussed transitioning from weekly sessions to every other week, as progress in symptoms and mental health has been noted.   Follow Up Instructions: Counselor will contact Lynn [name] in 2 weeks for our next session. Lynn [name] will continue taking medications and contact pharmacist with medication concerns.     I discussed the assessment and treatment plan with the patient. The patient was provided an opportunity to ask questions and all were answered. The patient agreed with the plan and demonstrated an understanding of the instructions.   The patient was advised to call back or seek an in-person evaluation if the symptoms worsen or if the condition fails to improve as anticipated.  I provided 55 minutes of non-face-to-face time during this encounter.   Hilbert Odor, LCSW

## 2018-08-30 ENCOUNTER — Ambulatory Visit (HOSPITAL_COMMUNITY): Payer: Medicaid Other | Admitting: Psychiatry

## 2018-09-05 ENCOUNTER — Ambulatory Visit (HOSPITAL_COMMUNITY): Payer: Medicaid Other | Admitting: Psychiatry

## 2018-09-12 ENCOUNTER — Ambulatory Visit (HOSPITAL_COMMUNITY): Payer: Medicaid Other | Admitting: Psychiatry

## 2018-09-12 ENCOUNTER — Other Ambulatory Visit: Payer: Self-pay

## 2018-09-19 ENCOUNTER — Ambulatory Visit (HOSPITAL_COMMUNITY): Payer: Medicaid Other | Admitting: Psychiatry

## 2018-09-26 ENCOUNTER — Ambulatory Visit (INDEPENDENT_AMBULATORY_CARE_PROVIDER_SITE_OTHER): Payer: Medicaid Other | Admitting: Psychiatry

## 2018-09-26 ENCOUNTER — Encounter (HOSPITAL_COMMUNITY): Payer: Self-pay | Admitting: Psychiatry

## 2018-09-26 ENCOUNTER — Telehealth (HOSPITAL_COMMUNITY): Payer: Self-pay

## 2018-09-26 ENCOUNTER — Other Ambulatory Visit: Payer: Self-pay

## 2018-09-26 DIAGNOSIS — F4312 Post-traumatic stress disorder, chronic: Secondary | ICD-10-CM

## 2018-09-26 DIAGNOSIS — F323 Major depressive disorder, single episode, severe with psychotic features: Secondary | ICD-10-CM | POA: Diagnosis not present

## 2018-09-26 NOTE — Telephone Encounter (Signed)
If symptoms are bad and she need to be evaluated in the emergency room.

## 2018-09-26 NOTE — Progress Notes (Signed)
Virtual Visit via Telephone Note  I connected with Lynn [name] on 09/26/18 at  9:00 AM EDT by telephone and verified that I am speaking with the correct person using two identifiers.   I discussed the limitations, risks, security and privacy concerns of performing an evaluation and management service by telephone and the availability of in person appointments. I also discussed with the patient that there may be a patient responsible charge related to this service. The patient expressed understanding and agreed to proceed.   History of Present Illness: Major Depressive Disorder, severe with psychotic features and Chronic PTSD due to current medical conditions, adverse life experiences, and familial conflict.    Observations/Objective: Counselor met with Lynn [name] for individual therapy over the phone. When we began Lynn [name] sounded very drowsy, pained and confused. Lynn [name] was eventually able to realize who she was speaking to and began sharing her current concerns. Lynn [name] reports that she has been completely isolated for weeks, she is having auditory hallucinations and is quite paranoid. She has not slept the past 4-5 nights because she said she has to stay awake at night to protect herself and she is fearful she will wake to people standing over her. She denied visual hallucinations, but she reported that she believes people are attempting to climb up ladders to look and get into her window. In March she also had all of her teeth removed and she has been in extreme pain the past week or so because bone fragments are coming through the gums and her gums are swollen, sore and open. She reported that she has not been taking medications as prescribed because of her routine being offset and that she has ran out of medications for diabetes care and mental health. Counselor instructed Lynn [name] that she must follow up with medical professionals about her condition. We discussed coping strategies to deal with the  hallucinations and depressive symptoms. Counselor assessed connections with her support system, which are limited. Counselor encouraged her to contact the pharmacy to refill and obtain prescriptions. Counselor promoted basic self care. We will plan to meet again in a week or less to follow up. Counselor expressed concern and attempted to motivate Lynn [name] to work towards getting care back on track.   Assessment and Plan: Counselor is very concerned about Lynn [name]'s well-being and current depressive state. Counselor is highly encouraging Lynn [name] to contact medical professionals to address physical and mental health concerns. Counselor discussed contacting Lynn [name]'s family or APS if she is unable to contact and address the current needs for her health and safety. Counselor will meet with Lynn [name] again within the week to follow up.   Follow Up Instructions: Counselor will set Lynn [name] up with a FU appointment for this week or next. Lynn [name] will be contacted by med management to address depressive and psychotic symptoms. Lynn [name] will contact her primary care and pharmacy to address pain and medication needs.    I discussed the assessment and treatment plan with the patient. The patient was provided an opportunity to ask questions and all were answered. The patient agreed with the plan and demonstrated an understanding of the instructions.   The patient was advised to call back or seek an in-person evaluation if the symptoms worsen or if the condition fails to improve as anticipated.  I provided 48 minutes of non-face-to-face time during this encounter.   Lise Auer, LCSW

## 2018-09-26 NOTE — Telephone Encounter (Signed)
E mail received from patients therapist;  Rudy Jew, Miss seeing you!! I called a client today who has seen Dr. Lolly Mustache once in Feb. She was in really bad condition. She has been completely isolated for weeks, she is having auditory hallucinations and is quite paranoid. She has not slept the past 4-5 nights because she said she has to stay awake at night to protect herself and she is fearful she will wake to people standing over her. She denied visual hallucinations, but she reported that she believes people are attempting to climb up ladders to look and get into her window. She lives with her adult daughter (who completely ignores her), her husband and 3 teen grandchildren (who check on her sometimes). She reported being out of some of her medications Dr. Lolly Mustache prescribed her, she been out of her diabetes supplies and may be out of some of the other medications. In March she also had all of her teeth removed and she has been in extreme pain the past week or so because bone fragments are coming through the gums and her gums are swollen, sore and open. I instructed her that she needed to follow up with her primary doctor (who she has seen for over a decade) to look at being seen and checking on medications. She really couldn't recall the Drs visit with Dr. Lolly Mustache, but I saw it in the system. Not sure what you can do on your end, but if you would mind following up with her today or tomorrow I would appreciate it. I'm really concerned about her.   Melida Toller: MRN:  [id number] [phone]  Thanks, Toma Copier   Please review and advise, thank you

## 2018-10-03 ENCOUNTER — Ambulatory Visit (HOSPITAL_COMMUNITY): Payer: Medicaid Other | Admitting: Psychiatry

## 2018-10-03 ENCOUNTER — Other Ambulatory Visit: Payer: Self-pay

## 2018-10-10 ENCOUNTER — Other Ambulatory Visit: Payer: Self-pay

## 2018-10-10 ENCOUNTER — Ambulatory Visit (INDEPENDENT_AMBULATORY_CARE_PROVIDER_SITE_OTHER): Payer: Medicaid Other | Admitting: Psychiatry

## 2018-10-10 DIAGNOSIS — F323 Major depressive disorder, single episode, severe with psychotic features: Secondary | ICD-10-CM | POA: Diagnosis not present

## 2018-10-10 DIAGNOSIS — F4312 Post-traumatic stress disorder, chronic: Secondary | ICD-10-CM

## 2018-10-11 ENCOUNTER — Encounter (HOSPITAL_COMMUNITY): Payer: Self-pay | Admitting: Psychiatry

## 2018-10-11 NOTE — Progress Notes (Signed)
Virtual Visit via Telephone Note  I connected with Lynn [name] on 10/11/18 at  2:30 PM EDT by telephone and verified that I am speaking with the correct person using two identifiers.  Location: Patient: Lynn [name] Provider: Lise Auer, LCSW   I discussed the limitations, risks, security and privacy concerns of performing an evaluation and management service by telephone and the availability of in person appointments. I also discussed with the patient that there may be a patient responsible charge related to this service. The patient expressed understanding and agreed to proceed.   History of Present Illness: MDD and PTSD due to adverse life events, familial problems, housing issues, financial issues and complex medical conditions.    Observations/Objective: Counselor met with Lynn [name] for individual therapy via Webex. Counselor assessed MH symptoms and progress on treatment plan goals. Lynn [name] shared that she followed up with her medical providers and they were able to get her on medications to sleep and adjusted other meds. Since she reports having decrease in visual and auditory hallucinations. Her depression has decreased, but anxiety has made little improvement. Counselor expressed concern with the condition she was in at last session and promoted ways to be preventative to avoid future episodes. Counselor explored progress on housing, finances and support system issues. Lynn [name] expressed concern about her grandchildren's mental health and her desire to move out of their current situation. Lynn [name] shared positive information about vocation and church members reaching out to help her. Counselor provided therapy homework to continue stabilization of mental health. Lynn [name] denied suicidal ideation or self-harm  Assessment and Plan: Counselor will continue to meet with Lynn [name] to address treatment plan goals. Lynn [name] will continue to follow recommendations of providers and implement  skills learned in session.  Follow Up Instructions: Counselor will send information for next session via Webex.    I discussed the assessment and treatment plan with the patient. The patient was provided an opportunity to ask questions and all were answered. The patient agreed with the plan and demonstrated an understanding of the instructions.   The patient was advised to call back or seek an in-person evaluation if the symptoms worsen or if the condition fails to improve as anticipated.  I provided 55 minutes of non-face-to-face time during this encounter.   Lise Auer, LCSW

## 2018-10-17 ENCOUNTER — Ambulatory Visit (INDEPENDENT_AMBULATORY_CARE_PROVIDER_SITE_OTHER): Payer: Medicaid Other | Admitting: Psychiatry

## 2018-10-17 ENCOUNTER — Encounter (HOSPITAL_COMMUNITY): Payer: Self-pay | Admitting: Psychiatry

## 2018-10-17 ENCOUNTER — Other Ambulatory Visit: Payer: Self-pay

## 2018-10-17 DIAGNOSIS — F323 Major depressive disorder, single episode, severe with psychotic features: Secondary | ICD-10-CM | POA: Diagnosis not present

## 2018-10-17 DIAGNOSIS — F4312 Post-traumatic stress disorder, chronic: Secondary | ICD-10-CM | POA: Diagnosis not present

## 2018-10-17 NOTE — Progress Notes (Signed)
Virtual Visit via Video Note  I connected with Lynn [name] on 10/17/18 at  1:30 PM EDT by a video enabled telemedicine application and verified that I am speaking with the correct person using two identifiers.  Location: Patient: Lynn [name] Provider: Lise Auer, LCSW   I discussed the limitations of evaluation and management by telemedicine and the availability of in person appointments. The patient expressed understanding and agreed to proceed.  History of Present Illness: MDD and PTSD due to complex medical conditions, adverse life experiences, family conflict, financial issues and housing issues.   Observations/Objective: Counselor met with Lynn [name] for individual therapy via Webex. Counselor assessed MH symptoms and progress on treatment plan goals.Lynn [name] denied suicidal ideation or self-harm behaviors.  Lynn [name] shared that her depression and anxiety has improved due to the change in medications from a few weeks ago. She was happy, positive and showed passion in her communications. Counselor noted positive attributes to [place] and we worked on accepting compliments and positive self-talk. Counselor explore physical health concerns Lynn [name] was having and how its impacting her overall functioning. Counselor and Lynn [name] discussed progress on financial assistance and housing. Lynn [name] was hopeful about her situation and getting connected with the right resources.    Assessment and Plan: Counselor will continue to meet with Lynn [name] to address treatment plan goals. Lynn [name] will continue to follow recommendations of providers and implement skills learned in session.  Follow Up Instructions: Counselor will send information for next session via Webex.     I discussed the assessment and treatment plan with the patient. The patient was provided an opportunity to ask questions and all were answered. The patient agreed with the plan and demonstrated an understanding of the instructions.    The patient was advised to call back or seek an in-person evaluation if the symptoms worsen or if the condition fails to improve as anticipated.  I provided 45 minutes of non-face-to-face time during this encounter.   Lise Auer, LCSW

## 2018-10-25 ENCOUNTER — Ambulatory Visit (HOSPITAL_COMMUNITY): Payer: Medicaid Other | Admitting: Psychiatry

## 2018-11-15 ENCOUNTER — Other Ambulatory Visit: Payer: Self-pay

## 2018-11-15 ENCOUNTER — Ambulatory Visit (INDEPENDENT_AMBULATORY_CARE_PROVIDER_SITE_OTHER): Payer: Medicaid Other | Admitting: Psychiatry

## 2018-11-15 DIAGNOSIS — F331 Major depressive disorder, recurrent, moderate: Secondary | ICD-10-CM

## 2018-11-15 DIAGNOSIS — F4312 Post-traumatic stress disorder, chronic: Secondary | ICD-10-CM | POA: Diagnosis not present

## 2018-11-16 ENCOUNTER — Encounter (HOSPITAL_COMMUNITY): Payer: Self-pay | Admitting: Psychiatry

## 2018-11-16 NOTE — Progress Notes (Signed)
Virtual Visit via Video Note  I connected with Lynn [name] on 11/16/18 at 10:00 AM EDT by a video enabled telemedicine application and verified that I am speaking with the correct person using two identifiers.  Location: Patient: Lynn [name] Provider:  , LCSW   I discussed the limitations of evaluation and management by telemedicine and the availability of in person appointments. The patient expressed understanding and agreed to proceed.  History of Present Illness: PTSD and MDD   Observations/Objective: Counselor met with Lynn [name] for individual therapy via Webex. Counselor assessed MH symptoms and progress on treatment plan goals. Lynn [name] denied suicidal ideation or self-harm behaviors. Lynn [name] shared that she is not having active A/V hallucinations. Counselor assessed daily functioning, medication routine, upcoming appointments and self-care practices. Lynn [name] reported that her faith is what keeps her motivated, strong and stable. She reported positive changes in her mood and decrease in anxious symptoms. Counselor and Lynn [name] discussed her plans with gaining disability benefits and securing her insurance so she can remain in medical treatments. Counselor provided community resources to assist in her efforts.   Assessment and Plan: Counselor will continue to meet with Lynn [name] to address treatment plan goals. Lynn [name] will continue to follow recommendations of providers and implement skills learned in session.  Follow Up Instructions: Counselor will send information for next session via Webex.   I provided 55 minutes of non-face-to-face time during this encounter.    , LCSW  

## 2018-11-29 ENCOUNTER — Encounter (HOSPITAL_COMMUNITY): Payer: Self-pay | Admitting: Psychiatry

## 2018-11-29 ENCOUNTER — Ambulatory Visit (INDEPENDENT_AMBULATORY_CARE_PROVIDER_SITE_OTHER): Payer: Medicaid Other | Admitting: Psychiatry

## 2018-11-29 ENCOUNTER — Other Ambulatory Visit: Payer: Self-pay

## 2018-11-29 DIAGNOSIS — F331 Major depressive disorder, recurrent, moderate: Secondary | ICD-10-CM

## 2018-11-29 DIAGNOSIS — F4312 Post-traumatic stress disorder, chronic: Secondary | ICD-10-CM | POA: Diagnosis not present

## 2018-11-29 NOTE — Progress Notes (Signed)
Virtual Visit via Telephone Note  I connected with Lynn [name] on 11/29/18 at 11:00 AM EDT by telephone and verified that I am speaking with the correct person using two identifiers.  Location: Patient: Lynn [name] Provider: Lise Auer, LCSW   I discussed the limitations, risks, security and privacy concerns of performing an evaluation and management service by telephone and the availability of in person appointments. I also discussed with the patient that there may be a patient responsible charge related to this service. The patient expressed understanding and agreed to proceed.   History of Present Illness: PTSD and MDD   Observations/Objective: Counselor met with Lynn [name] for individual therapy via Webex. Counselor assessed MH symptoms and progress on treatment plan goals. Lynn [name] denied suicidal ideation or self-harm behaviors. Lynn [name] shared that she is having some health complications which have been increasing her anxiety and depression, as well as assuming the caregiver role for her sister who is passing. Counselor took note of her health issues-high blood pressure, weight gain, having to use a cane to walk due to stiffness and pain in her legs, and chest pains. Counselor will reach out to Psychiatrist to determine if if is from a psyc medication and Lynn [name] will make an appointment with her primary care doctor. Counselor explored her thoughts and feelings about her sister's declining health condition and her role. Lynn [name] expressed distress because this is her only sister and she's never had to be a caregiver or help with funeral arrangements before. Counselor shared information for Hosp[facility] to connect her with support and resources. Lynn [name] excitedly shared that things are coming together for her housing and her dental work. Counselor reviewed healthy coping skills and daily habits for Lynn [name] to better manage mental health symptoms.    Assessment and Plan: Counselor will continue  to meet with Lynn [name] to address treatment plan goals. Lynn [name] will continue to follow recommendations of providers and implement skills learned in session.  Follow Up Instructions: Counselor will send information for next session via Webex.     I discussed the assessment and treatment plan with the patient. The patient was provided an opportunity to ask questions and all were answered. The patient agreed with the plan and demonstrated an understanding of the instructions.   The patient was advised to call back or seek an in-person evaluation if the symptoms worsen or if the condition fails to improve as anticipated.  I provided 55 minutes of non-face-to-face time during this encounter.   Lise Auer, LCSW

## 2018-12-13 ENCOUNTER — Encounter (HOSPITAL_COMMUNITY): Payer: Self-pay | Admitting: Psychiatry

## 2018-12-13 ENCOUNTER — Ambulatory Visit (INDEPENDENT_AMBULATORY_CARE_PROVIDER_SITE_OTHER): Payer: Medicaid Other | Admitting: Psychiatry

## 2018-12-13 ENCOUNTER — Other Ambulatory Visit: Payer: Self-pay

## 2018-12-13 DIAGNOSIS — F4312 Post-traumatic stress disorder, chronic: Secondary | ICD-10-CM | POA: Diagnosis not present

## 2018-12-13 DIAGNOSIS — F331 Major depressive disorder, recurrent, moderate: Secondary | ICD-10-CM

## 2018-12-13 NOTE — Progress Notes (Signed)
Virtual Visit via Telephone Note  I connected with Thornton Dales on 12/13/18 at 10:00 AM EDT by telephone and verified that I am speaking with the correct person using two identifiers.  Location: Patient: Lynn [name] Provider: Lise Auer, LCSW   I discussed the limitations, risks, security and privacy concerns of performing an evaluation and management service by telephone and the availability of in person appointments. I also discussed with the patient that there may be a patient responsible charge related to this service. The patient expressed understanding and agreed to proceed.   History of Present Illness: Chronic PTSD and MDD   Observations/Objective: Counselor met with Helyne for individual therapy via telephone. Counselor assessed MH symptoms and progress on treatment plan goals. Yomaris denied suicidal ideation or self-harm behaviors. Ashea shared that she has increased stress from the dynamics between her and her sister. Counselor and Jacqui discussed assertive communication and boundary setting for her mental health and well-being. Counselor celebrated with [name] as she shared positive news about her disability process and her dental work that she has been looking forward to. Counselor assessed daily functioning, medication concerns, health concerns and self-care routine. Counselor encouraged Malak to continue safe habits when interacting with others in public and praised her for the great progress she has made on goals and in building her confidence and positive self-esteem.   Assessment and Plan: Counselor will continue to meet with Ottie to address treatment plan goals. Kayani will continue to follow recommendations of providers and implement skills learned in session.  Follow Up Instructions: Counselor will send information for next session via Webex.     I discussed the assessment and treatment plan with the patient. The patient was provided an opportunity  to ask questions and all were answered. The patient agreed with the plan and demonstrated an understanding of the instructions.   The patient was advised to call back or seek an in-person evaluation if the symptoms worsen or if the condition fails to improve as anticipated.  I provided 55 minutes of non-face-to-face time during this encounter.   Lise Auer, LCSW

## 2018-12-27 ENCOUNTER — Ambulatory Visit (HOSPITAL_COMMUNITY): Payer: Medicaid Other | Admitting: Psychiatry

## 2019-01-02 ENCOUNTER — Other Ambulatory Visit: Payer: Self-pay

## 2019-01-02 ENCOUNTER — Encounter (HOSPITAL_COMMUNITY): Payer: Self-pay | Admitting: Psychiatry

## 2019-01-02 ENCOUNTER — Ambulatory Visit (INDEPENDENT_AMBULATORY_CARE_PROVIDER_SITE_OTHER): Payer: Medicaid Other | Admitting: Psychiatry

## 2019-01-02 DIAGNOSIS — F4312 Post-traumatic stress disorder, chronic: Secondary | ICD-10-CM | POA: Diagnosis not present

## 2019-01-02 DIAGNOSIS — F331 Major depressive disorder, recurrent, moderate: Secondary | ICD-10-CM

## 2019-01-02 DIAGNOSIS — F333 Major depressive disorder, recurrent, severe with psychotic symptoms: Secondary | ICD-10-CM

## 2019-01-02 MED ORDER — BENZTROPINE MESYLATE 0.5 MG PO TABS: 0.5000 mg | ORAL_TABLET | Freq: Every day | ORAL | 1 refills | Status: DC

## 2019-01-02 MED ORDER — TRAZODONE HCL 100 MG PO TABS: 100.0000 mg | ORAL_TABLET | Freq: Every day | ORAL | 1 refills | Status: DC

## 2019-01-02 MED ORDER — FLUOXETINE HCL 10 MG PO CAPS: ORAL_CAPSULE | ORAL | 1 refills | Status: DC

## 2019-01-02 MED ORDER — LURASIDONE HCL 40 MG PO TABS: 40.0000 mg | ORAL_TABLET | Freq: Every day | ORAL | 1 refills | Status: DC
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
## 2019-01-10 ENCOUNTER — Ambulatory Visit (HOSPITAL_COMMUNITY): Payer: Medicaid Other | Admitting: Psychiatry

## 2019-01-16 ENCOUNTER — Ambulatory Visit (INDEPENDENT_AMBULATORY_CARE_PROVIDER_SITE_OTHER): Payer: Medicaid Other | Admitting: Psychiatry

## 2019-01-16 ENCOUNTER — Other Ambulatory Visit: Payer: Self-pay

## 2019-01-16 DIAGNOSIS — F331 Major depressive disorder, recurrent, moderate: Secondary | ICD-10-CM

## 2019-01-16 DIAGNOSIS — F4312 Post-traumatic stress disorder, chronic: Secondary | ICD-10-CM

## 2019-01-17 ENCOUNTER — Encounter (HOSPITAL_COMMUNITY): Payer: Self-pay | Admitting: Psychiatry
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
## 2019-01-24 ENCOUNTER — Other Ambulatory Visit: Payer: Self-pay

## 2019-01-24 ENCOUNTER — Ambulatory Visit (INDEPENDENT_AMBULATORY_CARE_PROVIDER_SITE_OTHER): Payer: Medicaid Other | Admitting: Psychiatry

## 2019-01-24 DIAGNOSIS — F331 Major depressive disorder, recurrent, moderate: Secondary | ICD-10-CM | POA: Diagnosis not present

## 2019-01-24 DIAGNOSIS — F4312 Post-traumatic stress disorder, chronic: Secondary | ICD-10-CM | POA: Diagnosis not present

## 2019-01-24 NOTE — Progress Notes (Signed)
Virtual Visit via Video Note  I connected with Lynn [name] on 01/24/19 at  1:00 PM EDT by a video enabled telemedicine application and verified that I am speaking with Lynn correct person using two identifiers.  Location: [name]: Lynn [name] Provider: Lise Auer, LCSW   I discussed Lynn limitations of evaluation and management by telemedicine and Lynn availability of in person appointments. Lynn [name] expressed understanding and agreed to proceed.  History of Present Illness: MDD and Chronic PTSD   Observations/Objective: Counselor met with Lynn [name] for individual therapy via Webex. Counselor assessed MH symptoms and progress on treatment plan goals. Lynn [name] denied suicidal ideation or self-harm behaviors. Lynn [name] shared about positive changes in her relationships and living situation. Counselor assessed her thoughts and feelings about these changes. Lynn [name] shared Lynn history of relationship dynamics in her family and how she's been impacted. Counselor and Lynn [name] explored traumatic experiences in her life and how it has shaped her life choices. Counselor encouraged Lynn [name] to contact and follow up with her medical professionals for psych mediations and for eye glasses for vision issues.   Assessment and Plan: Counselor will continue to meet with [name] to address treatment plan goals. [name] will continue to follow recommendations of providers and implement skills learned in session.  Follow Up Instructions: Counselor will send information for next session via Webex.     I discussed Lynn assessment and treatment plan with Lynn [name]. Lynn [name] was provided an opportunity to ask questions and all were answered. Lynn [name] agreed with Lynn plan and demonstrated an understanding of Lynn instructions.   Lynn [name] was advised to call back or seek an in-person evaluation if Lynn symptoms worsen or if Lynn condition fails to improve as anticipated.  I provided 60 minutes of  non-face-to-face time during this encounter.   Lise Auer, LCSW

## 2019-01-25 ENCOUNTER — Encounter (HOSPITAL_COMMUNITY): Payer: Self-pay | Admitting: Psychiatry

## 2019-02-14 ENCOUNTER — Ambulatory Visit (INDEPENDENT_AMBULATORY_CARE_PROVIDER_SITE_OTHER): Payer: Medicaid Other | Admitting: Psychiatry

## 2019-02-14 ENCOUNTER — Other Ambulatory Visit: Payer: Self-pay

## 2019-02-14 ENCOUNTER — Encounter (HOSPITAL_COMMUNITY): Payer: Self-pay | Admitting: Psychiatry

## 2019-02-14 DIAGNOSIS — F331 Major depressive disorder, recurrent, moderate: Secondary | ICD-10-CM | POA: Diagnosis not present

## 2019-02-14 DIAGNOSIS — F4312 Post-traumatic stress disorder, chronic: Secondary | ICD-10-CM

## 2019-02-14 MED ORDER — FLUOXETINE HCL 20 MG PO CAPS: 20.0000 mg | ORAL_CAPSULE | Freq: Every day | ORAL | 1 refills | Status: DC

## 2019-02-14 MED ORDER — BENZTROPINE MESYLATE 0.5 MG PO TABS: 0.5000 mg | ORAL_TABLET | Freq: Every day | ORAL | 1 refills | Status: DC

## 2019-02-14 MED ORDER — TRAZODONE HCL 100 MG PO TABS: 100.0000 mg | ORAL_TABLET | Freq: Every day | ORAL | 1 refills | Status: DC

## 2019-02-14 MED ORDER — LURASIDONE HCL 40 MG PO TABS: 40.0000 mg | ORAL_TABLET | Freq: Every day | ORAL | 1 refills | Status: DC
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
## 2019-02-27 ENCOUNTER — Other Ambulatory Visit: Payer: Self-pay

## 2019-02-27 ENCOUNTER — Encounter (HOSPITAL_COMMUNITY): Payer: Self-pay | Admitting: Psychiatry

## 2019-02-27 ENCOUNTER — Ambulatory Visit (INDEPENDENT_AMBULATORY_CARE_PROVIDER_SITE_OTHER): Payer: Medicaid Other | Admitting: Psychiatry

## 2019-02-27 DIAGNOSIS — F331 Major depressive disorder, recurrent, moderate: Secondary | ICD-10-CM | POA: Diagnosis not present

## 2019-02-27 DIAGNOSIS — F4312 Post-traumatic stress disorder, chronic: Secondary | ICD-10-CM

## 2019-02-27 NOTE — Progress Notes (Signed)
Virtual Visit via Video Note  I connected with Lynn [name] on 02/27/19 at  4:00 PM EDT by a video enabled telemedicine application and verified that I am speaking with the correct person using two identifiers.  Location: Patient: Lynn [name] Provider: Lise Auer, LCSW   I discussed the limitations of evaluation and management by telemedicine and the availability of in person appointments. The patient expressed understanding and agreed to proceed.  History of Present Illness: PTSD and MDD   Observations/Objective: Counselor met with Lynn [name] for individual therapy via Webex. Counselor assessed MH symptoms and progress on treatment plan goals. Lynn [name] denied suicidal ideation or self-harm behaviors. Lynn [name] shared that she has been doing better in maintaining positive mental health over the past 2 weeks. Lynn [name] shared that she is actively disconnecting from toxic people and situations that in the past would be distressing for her. Lynn [name] was excited to share that progress is being made on her disability claim. Counselor assessed daily functioning. Lynn [name] continues to have a difficult time managing physical health needs in relation to leg pain, diabetes regimen, healthy diet and medication needs. Lynn [name] is experiencing vision issues and is in need of an eye exam. Counselor encouraged her to set and follow up with appointments with providers and to be assertive in asking questions regarding her health. Counselor and Lynn [name] reflected on her progress in treatment, comparing how she presented as shy, timid and self-doubting upon intake, to how she is feeling more empowered to speak up for her self, her needs, her health and her wellbeing. Lynn [name] stated that she is more optimistic about her situation than she has been in years/decades due to engagement in treatment.   Assessment and Plan: Counselor will continue to meet with patient to address treatment plan goals. Patient will continue to  follow recommendations of providers and implement skills learned in session.  Follow Up Instructions: Counselor will send information for next session via Webex.     I discussed the assessment and treatment plan with the patient. The patient was provided an opportunity to ask questions and all were answered. The patient agreed with the plan and demonstrated an understanding of the instructions.   The patient was advised to call back or seek an in-person evaluation if the symptoms worsen or if the condition fails to improve as anticipated.  I provided 60 minutes of non-face-to-face time during this encounter.   Lise Auer, LCSW

## 2019-03-05 ENCOUNTER — Emergency Department
Admission: EM | Admit: 2019-03-05 | Discharge: 2019-03-06 | Disposition: A | Discharge status: Home / Self Care | Payer: Medicaid Other | Attending: Emergency Medicine | Admitting: Emergency Medicine

## 2019-03-05 ENCOUNTER — Other Ambulatory Visit: Payer: Self-pay

## 2019-03-05 DIAGNOSIS — E119 Type 2 diabetes mellitus without complications: Secondary | ICD-10-CM | POA: Diagnosis not present

## 2019-03-05 DIAGNOSIS — Z79899 Other long term (current) drug therapy: Secondary | ICD-10-CM | POA: Insufficient documentation

## 2019-03-05 DIAGNOSIS — Z7984 Long term (current) use of oral hypoglycemic drugs: Secondary | ICD-10-CM | POA: Insufficient documentation

## 2019-03-05 DIAGNOSIS — K625 Hemorrhage of anus and rectum: Secondary | ICD-10-CM | POA: Diagnosis present

## 2019-03-05 DIAGNOSIS — K5900 Constipation, unspecified: Secondary | ICD-10-CM | POA: Diagnosis not present

## 2019-03-05 DIAGNOSIS — E860 Dehydration: Secondary | ICD-10-CM

## 2019-03-05 DIAGNOSIS — I10 Essential (primary) hypertension: Secondary | ICD-10-CM | POA: Insufficient documentation

## 2019-03-05 HISTORY — DX: Obesity, unspecified: E66.9

## 2019-03-05 LAB — TYPE AND SCREEN
ABO/RH(D): B POS
Antibody Screen: NEGATIVE

## 2019-03-05 LAB — COMPREHENSIVE METABOLIC PANEL
ALT: 17 U/L (ref 0–44)
AST: 13 U/L — ABNORMAL LOW (ref 15–41)
Albumin: 3.8 g/dL (ref 3.5–5.0)
Alkaline Phosphatase: 99 U/L (ref 38–126)
Anion gap: 11 (ref 5–15)
BUN: 21 mg/dL — ABNORMAL HIGH (ref 6–20)
CO2: 29 mmol/L (ref 22–32)
Calcium: 8.8 mg/dL — ABNORMAL LOW (ref 8.9–10.3)
Chloride: 101 mmol/L (ref 98–111)
Creatinine, Ser: 1.43 mg/dL — ABNORMAL HIGH (ref 0.44–1.00)
GFR calc Af Amer: 47 mL/min — ABNORMAL LOW (ref >60)
GFR calc non Af Amer: 41 mL/min — ABNORMAL LOW (ref >60)
Glucose, Bld: 165 mg/dL — ABNORMAL HIGH (ref 70–99)
Potassium: 3.4 mmol/L — ABNORMAL LOW (ref 3.5–5.1)
Sodium: 141 mmol/L (ref 135–145)
Total Bilirubin: 0.6 mg/dL (ref 0.3–1.2)
Total Protein: 7.7 g/dL (ref 6.5–8.1)

## 2019-03-05 LAB — CBC
HCT: 35.6 % — ABNORMAL LOW (ref 36.0–46.0)
Hemoglobin: 11.8 g/dL — ABNORMAL LOW (ref 12.0–15.0)
MCH: 29.7 pg (ref 26.0–34.0)
MCHC: 33.1 g/dL (ref 30.0–36.0)
MCV: 89.7 fL (ref 80.0–100.0)
Platelets: 232 10*3/uL (ref 150–400)
RBC: 3.97 MIL/uL (ref 3.87–5.11)
RDW: 13.3 % (ref 11.5–15.5)
WBC: 7.9 10*3/uL (ref 4.0–10.5)
nRBC: 0 % (ref 0.0–0.2)

## 2019-03-05 NOTE — ED Triage Notes (Signed)
Pt to the er for rectal bleeding that said it was both dark and bright and was seen in the toilet as well as on the paper. Pt states she has been constipated.

## 2019-03-05 NOTE — ED Notes (Signed)
Pt states she has not had a bowel movement in a month. Pt states when she attempts to pass bowel movement, "it feels stuck in there and blood comes out on the paper". Pt states pressure from rectum causes her to feel like she cannot urinate. Pt states last urination this afternoon.

## 2019-03-06 ENCOUNTER — Encounter: Payer: Self-pay | Admitting: Emergency Medicine

## 2019-03-06 MED ORDER — LACTULOSE 10 GM/15ML PO SOLN
20.0000 g | Freq: Once | ORAL | Status: AC
  Administered 2019-03-06: 20 g via ORAL
  Filled 2019-03-06: qty 30

## 2019-03-06 MED ORDER — LIDOCAINE VISCOUS HCL 2 % MT SOLN
15.0000 mL | Freq: Once | OROMUCOSAL | Status: AC
  Administered 2019-03-06: 01:00:00 15 mL via OROMUCOSAL

## 2019-03-06 MED ORDER — POTASSIUM CHLORIDE CRYS ER 20 MEQ PO TBCR
40.0000 meq | EXTENDED_RELEASE_TABLET | Freq: Once | ORAL | Status: AC
  Administered 2019-03-06: 40 meq via ORAL
  Filled 2019-03-06: qty 2

## 2019-03-06 NOTE — ED Notes (Signed)
Soap suds enema administered.

## 2019-03-06 NOTE — Discharge Instructions (Addendum)
As we discussed, believe your symptoms are the result of being constipated for an extended period of time which is most likely lead to an internal hemorrhoid which is causing the blood you are seeing.  Please try using the over-the-counter remedies listed below to help with your constipation.  Keep in mind that you can do the MiraLAX in particular up to twice a day (17 g), and if you drink it with plenty of fluids, you will have a bowel movement.  I recommend that you call the office of Dr. Alice Reichert to schedule follow-up appointment with a GI specialist who can do additional exams and look for any other sources of your bleeding.  We recommend that you use one or more of the following over-the-counter medications in the order described:   1)  Colace (or Dulcolax) 100 mg:  This is a stool softener, and you may take it once or twice a day as needed. 2)  Senna tablets:  This is a bowel stimulant that will help "push" out your stool. It is the next step to add after you have tried a stool softener. 3)  Miralax (powder):  This medication works by drawing additional fluid into your intestines and helps to flush out your stool.  Mix the powder with water or juice according to label instructions.  It may help if the Colace and Senna are not sufficient, but you must be sure to use the recommended amount of water or juice when you mix up the powder.  You can take a 17g dose twice a day as needed, but make sure you take it in plenty of water, juice, or Gatorade. 4)  Look for magnesium citrate at the pharmacy (it is usually a small glass bottle).  Drink the bottle according to the label instructions.  Remember that narcotic pain medications are constipating, so avoid them or minimize their use.  Drink plenty of fluids.  You appear to be a bit dehydrated tonight, and it is best of you drink the fluids by mouth (rather than IV) so that you can absorb them into your bowels and it will help with your constipation.  Please  return to the Emergency Department immediately if you develop new or worsening symptoms that concern you, such as (but not limited to) fever > 101 degrees, severe abdominal pain, or persistent vomiting.

## 2019-03-06 NOTE — ED Notes (Signed)
Pt up on commode having bowel movement and urinating.

## 2019-03-06 NOTE — ED Notes (Signed)
Patient verbalized understanding of discharge instructions. MD at bedside as well. Patient assisted to wheelchair after nonskid socks placed. Patient to lobby to await her son who is picking her up. Patient pleasant and cooperative at this time.

## 2019-03-06 NOTE — ED Notes (Signed)
Pt disimpacted of 2 large golf ball sized hard fecal pieces rectally. Pt up to commode to attempt to pass stool.

## 2019-03-06 NOTE — ED Provider Notes (Signed)
[facility] Emergency Department Provider Note  ____________________________________________   First MD Initiated Contact with Patient 03/06/19 0013     (approximate)  I have reviewed the triage vital signs and the nursing notes.   HISTORY  Chief Complaint Rectal Bleeding    HPI Lynn [name] is a 58 y.o. female with medical history as listed below who presents for evaluation of rectal bleeding.  She states that she has not been able to have a good bowel movement in about a month and she feels very constipated.  This is not a chronic issue for her but has become an issue recently.  She says that her doctor told her to take some Colace but it has not helped.  She has not tried any other stool softener or laxative.  She strains hard to have a bowel movement and mostly just gets back some bright red blood.  She also reports some feeling of abdominal distention and some generalized abdominal pain.  No nausea nor vomiting.  No contact with COVID-19 patients.  Denies fever/chills, sore throat, chest pain, shortness of breath, and dysuria.   The constipation is severe and nothing in particular makes it better or worse.        Past Medical History:  Diagnosis Date  . Depression   . Diabetes mellitus without complication (HCC)   . Hypertension   . Obesity     Patient Active Problem List   Diagnosis Date Noted  . Severe major depression, single episode, without psychotic features (HCC) 11/02/2017  . Diabetes mellitus without complication (HCC) 11/02/2017    Past Surgical History:  Procedure Laterality Date  . CESAREAN SECTION      Prior to Admission medications   Medication Sig Start Date End Date Taking? Authorizing Provider  amLODipine (NORVASC) 10 MG tablet Take 10 mg by mouth daily.    [provider]  atorvastatin (LIPITOR) 10 MG tablet Take 10 mg by mouth daily.    [provider]  benztropine (COGENTIN) 0.5 MG tablet Take 1  tablet (0.5 mg total) by mouth daily. 02/14/19 02/14/20  Arfeen, Phillips Grout, MD  cloNIDine (CATAPRES) 0.3 MG tablet Take 0.3 mg by mouth 2 (two) times daily.    [provider]  FLUoxetine (PROZAC) 20 MG capsule Take 1 capsule (20 mg total) by mouth daily. 02/14/19   Arfeen, Phillips Grout, MD  hydrALAZINE (APRESOLINE) 25 MG tablet  06/03/13   [provider]  hydrochlorothiazide (HYDRODIURIL) 25 MG tablet Take 25 mg by mouth daily.    [provider]  lisinopril (PRINIVIL,ZESTRIL) 20 MG tablet Take 20 mg by mouth daily.    [provider]  lurasidone (LATUDA) 40 MG TABS tablet Take 1 tablet (40 mg total) by mouth daily with breakfast. 02/14/19   Arfeen, Phillips Grout, MD  metFORMIN (GLUCOPHAGE-XR) 500 MG 24 hr tablet  06/03/13   [provider]  traZODone (DESYREL) 100 MG tablet Take 1 tablet (100 mg total) by mouth at bedtime. 02/14/19   Arfeen, Phillips Grout, MD    Allergies Patient has no known allergies.  History reviewed. No pertinent family history.  Social History Social History   Tobacco Use  . Smoking status: Never Smoker  . Smokeless tobacco: Never Used  Substance Use Topics  . Alcohol use: No  . Drug use: No    Review of Systems Constitutional: No fever/chills Eyes: No visual changes. ENT: No sore throat. Cardiovascular: Denies chest pain. Respiratory: Denies shortness of breath. Gastrointestinal: Severe constipation  over extended period of time with some rectal bleeding and generalized abdominal pain. Genitourinary: Negative for dysuria. Musculoskeletal: Negative for neck pain.  Negative for back pain. Integumentary: Negative for rash. Neurological: Negative for headaches, focal weakness or numbness.   ____________________________________________   PHYSICAL EXAM:  VITAL SIGNS: ED Triage Vitals  Enc Vitals Group     BP 03/05/19 2033 124/71     Pulse Rate 03/05/19 2033 (!) 114     Resp 03/05/19 2033 18     Temp 03/05/19 2033 98.1 F (36.7 C)      Temp Source 03/05/19 2033 Oral     SpO2 03/05/19 2033 98 %     Weight 03/05/19 2034 117.9 kg (260 lb)     Height 03/05/19 2034 1.651 m (5\' 5" )     Head Circumference --      Peak Flow --      Pain Score 03/05/19 2034 5     Pain Loc --      Pain Edu? --      Excl. in [place]? --     Constitutional: Alert and oriented.  No acute distress. Eyes: Conjunctivae are normal.  Head: Atraumatic. Nose: No congestion/rhinnorhea. Mouth/Throat: Mucous membranes are moist. Neck: No stridor.  No meningeal signs.   Cardiovascular: Normal rate, regular rhythm. Good peripheral circulation. Grossly normal heart sounds. Respiratory: Normal respiratory effort.  No retractions. Gastrointestinal: Obese.  Soft and nontender. No distention.  Rectal exam is notable for no obvious external abnormalities, substantial tenderness upon digital rectal exam, and a large stool ball within the rectum.  Bright red blood is present when I withdrew my finger which was strongly Hemoccult positive.  ED nurse, April, was present throughout the rectal exam. Musculoskeletal: No lower extremity tenderness nor edema. No gross deformities of extremities. Neurologic:  Normal speech and language. No gross focal neurologic deficits are appreciated.  Skin:  Skin is warm, dry and intact. Psychiatric: Mood and affect are normal. Speech and behavior are normal.  ____________________________________________   LABS (all labs ordered are listed, but only abnormal results are displayed)  Labs Reviewed  COMPREHENSIVE METABOLIC PANEL - Abnormal; Notable for the following components:      Result Value   Potassium 3.4 (*)    Glucose, Bld 165 (*)    BUN 21 (*)    Creatinine, Ser 1.43 (*)    Calcium 8.8 (*)    AST 13 (*)    GFR calc non Af Amer 41 (*)    GFR calc Af Amer 47 (*)    All other components within normal limits  CBC - Abnormal; Notable for the following components:   Hemoglobin 11.8 (*)    HCT 35.6 (*)    All other components  within normal limits  POC OCCULT BLOOD, ED  TYPE AND SCREEN   ____________________________________________  EKG  No indication for EKG ____________________________________________  RADIOLOGY I, Hinda Kehr, personally viewed and evaluated these images (plain radiographs) as part of my medical decision making, as well as reviewing the written report by the radiologist.  ED MD interpretation:  No indication for emergent imaging  Official radiology report(s): No results found.  ____________________________________________   PROCEDURES   Procedure(s) performed (including Critical Care):  Procedures   ____________________________________________   INITIAL IMPRESSION / MDM / [place] / ED COURSE  As part of my medical decision making, I reviewed the following data within the [facility] notes reviewed and incorporated, Labs reviewed , Old chart reviewed  and Notes from prior ED visits   Differential diagnosis includes, but is not limited to, constipation, anal fissure, internal hemorrhoid, AV malformation, diverticulosis.  The patient's primary issue is the constipation and rectal bleeding, not abdominal tenderness, and her abdominal exam was generally reassuring.  She had a large ball of stool in her rectum and she did not tolerate my exam.  However her nurse, April, is going to attempt disimpaction with the use of viscous lidocaine 2% and the patient's permission to give it a try.  He depending on how this goes and how she feels afterwards, we will either try an enema or additional oral laxatives.  I suspect that the rectal bleeding she is experiencing is secondary to an internal hemorrhoid from the chronic constipation and attempts to strain to have a bowel movement.  Her hemoglobin is reassuring and stable at about 11.8.  Her comprehensive metabolic panel is notable for decreased potassium which could be contributing to her constipation as well  as an elevation of her creatinine which likely is a volume issue.  I will encourage her to drink plenty of fluids and stay hydrated as well as giving her a potassium supplement.  I will reassess after the attempts at disimpaction.      Clinical Course as of Mar 06 607  Mon Mar 06, 2019  [id number] The patient's nurse April was able to manually remove several large stool balls.  The patient then slept for a while and then had a successful bowel movement after administration of an enema.  There is nothing else from an emergent perspective to do for tonight.  I encouraged the use of over-the-counter laxatives and stool softeners including MiraLAX and I encourage her to drink plenty of fluids since she appears to be a little bit volume depleted tonight.  She understands and agrees with the plan.   [CF]    Clinical Course User Index [CF] Loleta RoseForbach, Pinkney Venard, MD     ____________________________________________  FINAL CLINICAL IMPRESSION(S) / ED DIAGNOSES  Final diagnoses:  Constipation, unspecified constipation type  Rectal bleeding  Dehydration     MEDICATIONS GIVEN DURING THIS VISIT:  Medications  lidocaine (XYLOCAINE) 2 % viscous mouth solution 15 mL (15 mLs Mouth/Throat Given 03/06/19 0039)  potassium chloride SA (KLOR-CON) CR tablet 40 mEq (40 mEq Oral Given 03/06/19 0053)  lactulose (CHRONULAC) 10 GM/15ML solution 20 g (20 g Oral Given 03/06/19 0053)     ED Discharge Orders    None      *Please note:  Lynn JordanLoretta [name] was evaluated in Emergency Department on 03/06/2019 for the symptoms described in the history of present illness. She was evaluated in the context of the global COVID-19 pandemic, which necessitated consideration that the patient might be at risk for infection with the SARS-CoV-2 virus that causes COVID-19. Institutional protocols and algorithms that pertain to the evaluation of patients at risk for COVID-19 are in a state of rapid change based on information released by  regulatory bodies including the CDC and federal and state organizations. These policies and algorithms were followed during the patient's care in the ED.  Some ED evaluations and interventions may be delayed as a result of limited staffing during the pandemic.*  Note:  This document was prepared using Dragon voice recognition software and may include unintentional dictation errors.   Loleta RoseForbach, Eiman Maret, MD 03/06/19 [phone]

## 2019-03-06 NOTE — ED Notes (Signed)
Pt with passage of small amount of stool and enema. Pt states she "can't push anymore out because it hurts".

## 2019-03-06 NOTE — ED Notes (Signed)
Pt sleeping. Pt updated and encouraged to get up on toilet to have bowel movement.

## 2019-03-13 ENCOUNTER — Encounter (HOSPITAL_COMMUNITY): Payer: Self-pay | Admitting: Psychiatry

## 2019-03-13 ENCOUNTER — Other Ambulatory Visit: Payer: Self-pay

## 2019-03-13 ENCOUNTER — Ambulatory Visit (INDEPENDENT_AMBULATORY_CARE_PROVIDER_SITE_OTHER): Payer: Medicaid Other | Admitting: Psychiatry

## 2019-03-13 DIAGNOSIS — F331 Major depressive disorder, recurrent, moderate: Secondary | ICD-10-CM | POA: Diagnosis not present

## 2019-03-13 DIAGNOSIS — F4312 Post-traumatic stress disorder, chronic: Secondary | ICD-10-CM

## 2019-03-13 NOTE — Progress Notes (Signed)
Virtual Visit via Video Note  I connected with Thornton Dales on 03/13/19 at  1:00 PM EDT by a video enabled telemedicine application and verified that I am speaking with the correct person using two identifiers.  Location: Patient: Lynn [name] Provider: Lise Auer, LCSW   I discussed the limitations of evaluation and management by telemedicine and the availability of in person appointments. The patient expressed understanding and agreed to proceed.  History of Present Illness: PTSD and MDD   Observations/Objective: Counselor met with Jaynia for individual therapy via Webex. Counselor assessed MH symptoms and progress on treatment plan goals. Katina denied suicidal ideation or self-harm behaviors. Ha shared that over the past week she experienced a significant medical trauma/treatment due to rectal hemorrhaging. Counselor assessed her current state of health as well as her thoughts and feelings regarding the trauma. Counselor ensured that Jailee planned to follow up with providers and recommendations due to her negative experience. Leronda shared that she will be attending a primary care appointment tomorrow and will discuss how to move forward with care. Counselor assessed daily functioning. Trinidee shared positive reports about her disability claim process, positive updates on her relationship with her daughter and and how she is growing in her faith. Counselor and Jennavieve processed an anxiety producing situation, highlighting the coping skills and strategies Stormie successfully used. Counselor acknowledged and praised Oceanna for the progress she has made on her goals and in her life overall. Breeze reports being more optimistic and hopeful about her situation and future.    Assessment and Plan: Counselor will continue to meet with patient to address treatment plan goals. Patient will continue to follow recommendations of providers and implement skills learned in  session.  Follow Up Instructions: Counselor will send information for next session via Webex.     I discussed the assessment and treatment plan with the patient. The patient was provided an opportunity to ask questions and all were answered. The patient agreed with the plan and demonstrated an understanding of the instructions.   The patient was advised to call back or seek an in-person evaluation if the symptoms worsen or if the condition fails to improve as anticipated.  I provided 60 minutes of non-face-to-face time during this encounter.   Lise Auer, LCSW

## 2019-03-27 ENCOUNTER — Ambulatory Visit (INDEPENDENT_AMBULATORY_CARE_PROVIDER_SITE_OTHER): Payer: Medicaid Other | Admitting: Psychiatry

## 2019-03-27 ENCOUNTER — Encounter (HOSPITAL_COMMUNITY): Payer: Self-pay | Admitting: Psychiatry

## 2019-03-27 ENCOUNTER — Other Ambulatory Visit: Payer: Self-pay

## 2019-03-27 DIAGNOSIS — F4312 Post-traumatic stress disorder, chronic: Secondary | ICD-10-CM

## 2019-03-27 DIAGNOSIS — F331 Major depressive disorder, recurrent, moderate: Secondary | ICD-10-CM | POA: Diagnosis not present

## 2019-03-27 NOTE — Progress Notes (Signed)
Virtual Visit via Video Note  I connected with Lynn [name] on 03/27/19 at  1:00 PM EDT by a video enabled telemedicine application and verified that I am speaking with the correct person using two identifiers.  Location: Patient: Lynn [name] Provider: Lise Auer, LCSW   I discussed the limitations of evaluation and management by telemedicine and the availability of in person appointments. The patient expressed understanding and agreed to proceed.  History of Present Illness: MDD and PTSD   Observations/Objective: Counselor met with Lynn [name] for individual therapy via Webex. Counselor assessed MH symptoms and progress on treatment plan goals. Lynn [name] presented with mild depression and mild anxiety. Lynn [name] denied suicidal ideation or self-harm behaviors. Lynn [name] shared that she was able to meet with her primary care doctor in relation to her medical emergency, for follow up and referrals to specialist. Lynn [name] reported feeling good about the treatment plan they discussed and was already seeing the benefits to her change in diet, in increased energy and less discomfort. Counselor explored her thoughts, feelings and triggers related to the traumatic health issue and medical she received last month. She stated that she still experiences flashbacks and reminders, but that she knows she is safe now and is getting the corrective treatments she needs. Counselor assessed her social and support system connections and daily functioning. Lynn [name] shared about healthy coping strategies she is utilizing related to her faith and political practices. Counselor processed progress made on disability case and Lynn [name] expressed hope that is will be resolved and awarded within the next 2-3 months. Overall Lynn [name] reported that things are improving and manageable for her.    Assessment and Plan: Counselor will continue to meet with patient to address treatment plan goals. Patient will continue to follow  recommendations of providers and implement skills learned in session.  Follow Up Instructions: Counselor will send information for next session via Webex.     I discussed the assessment and treatment plan with the patient. The patient was provided an opportunity to ask questions and all were answered. The patient agreed with the plan and demonstrated an understanding of the instructions.   The patient was advised to call back or seek an in-person evaluation if the symptoms worsen or if the condition fails to improve as anticipated.  I provided 60 minutes of non-face-to-face time during this encounter.   Lise Auer, LCSW

## 2019-04-17 ENCOUNTER — Other Ambulatory Visit: Payer: Self-pay

## 2019-04-17 ENCOUNTER — Ambulatory Visit (INDEPENDENT_AMBULATORY_CARE_PROVIDER_SITE_OTHER): Payer: Medicaid Other | Admitting: Psychiatry

## 2019-04-17 DIAGNOSIS — F331 Major depressive disorder, recurrent, moderate: Secondary | ICD-10-CM

## 2019-04-17 DIAGNOSIS — F4312 Post-traumatic stress disorder, chronic: Secondary | ICD-10-CM

## 2019-04-17 MED ORDER — LURASIDONE HCL 40 MG PO TABS: 40.0000 mg | ORAL_TABLET | Freq: Every day | ORAL | 1 refills | Status: DC

## 2019-04-17 MED ORDER — BENZTROPINE MESYLATE 0.5 MG PO TABS: 0.5000 mg | ORAL_TABLET | Freq: Every day | ORAL | 1 refills | Status: DC

## 2019-04-17 MED ORDER — FLUOXETINE HCL 20 MG PO CAPS: 20.0000 mg | ORAL_CAPSULE | Freq: Every day | ORAL | 1 refills | Status: DC
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
## 2019-04-18 ENCOUNTER — Ambulatory Visit (INDEPENDENT_AMBULATORY_CARE_PROVIDER_SITE_OTHER): Payer: Medicaid Other | Admitting: Psychiatry

## 2019-04-18 DIAGNOSIS — F4312 Post-traumatic stress disorder, chronic: Secondary | ICD-10-CM

## 2019-04-18 DIAGNOSIS — F331 Major depressive disorder, recurrent, moderate: Secondary | ICD-10-CM | POA: Diagnosis not present

## 2019-04-21 ENCOUNTER — Encounter (HOSPITAL_COMMUNITY): Payer: Self-pay | Admitting: Psychiatry

## 2019-04-21 NOTE — Progress Notes (Signed)
Virtual Visit via Telephone Note  I connected with Lynn [name] on 04/18/19 at  3:00 PM EST by telephone and verified that I am speaking with the correct person using two identifiers.  Location: Patient: Lynn [name] Provider: Lise Auer, LCSW   I discussed the limitations, risks, security and privacy concerns of performing an evaluation and management service by telephone and the availability of in person appointments. I also discussed with the patient that there may be a patient responsible charge related to this service. The patient expressed understanding and agreed to proceed.   History of Present Illness: MDD and PTSD   Observations/Objective: Counselor met with Lynn [name] for individual therapy via Webex. Counselor assessed MH symptoms and progress on treatment plan goals. Lynn [name]. Lynn [name] denied suicidal ideation or self-harm behaviors. Lynn [name] excitedly shared that she was approved for SSI which qualifies her for subsidized housing. Counselor assessed how this impacts her future planning and correlates with her treatment goals. Lynn [name] shared hopefully about working towards finding her own place, regaining independence and improving overall mental health by doing so. Counselor and Lynn [name] reflected on her progress and hard work in getting resources in place to improve her living situation. Lynn [name] noted positive impacts on her self-esteem, confidence and communication skills. Counselor assessed daily functioning. Lynn [name] discussed physical health concerns and follow ups with doctors. Counselor and Lynn [name] discussed sleep hygiene and recommendations from her provider.   Assessment and Plan: Counselor will continue to meet with patient to address treatment plan goals. Patient will continue to follow recommendations of providers and implement skills learned in session.  Follow Up Instructions: Counselor will send information for next  session via Webex.     I discussed the assessment and treatment plan with the patient. The patient was provided an opportunity to ask questions and all were answered. The patient agreed with the plan and demonstrated an understanding of the instructions.   The patient was advised to call back or seek an in-person evaluation if the symptoms worsen or if the condition fails to improve as anticipated.  I provided 30 minutes of non-face-to-face time during this encounter.   Lise Auer, LCSW

## 2019-05-08 ENCOUNTER — Encounter (HOSPITAL_COMMUNITY): Payer: Self-pay | Admitting: Psychiatry

## 2019-05-08 ENCOUNTER — Ambulatory Visit (INDEPENDENT_AMBULATORY_CARE_PROVIDER_SITE_OTHER): Payer: Medicaid Other | Admitting: Psychiatry

## 2019-05-08 ENCOUNTER — Other Ambulatory Visit: Payer: Self-pay

## 2019-05-08 DIAGNOSIS — F4312 Post-traumatic stress disorder, chronic: Secondary | ICD-10-CM

## 2019-05-08 DIAGNOSIS — F331 Major depressive disorder, recurrent, moderate: Secondary | ICD-10-CM | POA: Diagnosis not present

## 2019-05-08 NOTE — Progress Notes (Signed)
Virtual Visit via Video Note  I connected with Lynn [name] on 05/08/19 at  1:00 PM EST by a video enabled telemedicine application and verified that I am speaking with the correct person using two identifiers.  Location: Patient: Lynn [name] Provider: Lise Auer, LCSW   I discussed the limitations of evaluation and management by telemedicine and the availability of in person appointments. The patient expressed understanding and agreed to proceed.  History of Present Illness: MDD and PTSD   Observations/Objective: Counselor met with Lynn [name] for individual therapy via Webex. Counselor assessed MH symptoms and progress on treatment plan goals. Lynn [name] presents with mild depression and mild anxiety. Lynn [name] denied suicidal ideation or self-harm behaviors. Lynn [name] shared that due to implementation of coping strategies and assertive communication skills, she is noting progress in her relationship with her daughter. Counselor processed the impact of the dynamic changes on Lynn [name]'s overall mood, mental health and self-esteem. Counselor assessed daily functioning and security of basic needs. Counselor provided psychotherapeutic interventions to address treatment plan goals. Lynn [name] shared positive reports on progress related to securing housing and med adherence.   Assessment and Plan: Counselor will continue to meet with patient to address treatment plan goals. Patient will continue to follow recommendations of providers and implement skills learned in session.  Follow Up Instructions: Counselor will send information for next session via Webex.    I discussed the assessment and treatment plan with the patient. The patient was provided an opportunity to ask questions and all were answered. The patient agreed with the plan and demonstrated an understanding of the instructions.   The patient was advised to call back or seek an in-person evaluation if the symptoms worsen or if the condition  fails to improve as anticipated.  I provided 55 minutes of non-face-to-face time during this encounter.   Lise Auer, LCSW

## 2019-05-29 ENCOUNTER — Other Ambulatory Visit: Payer: Self-pay

## 2019-05-29 ENCOUNTER — Ambulatory Visit (HOSPITAL_COMMUNITY): Payer: Medicaid Other | Admitting: Psychiatry

## 2019-06-12 ENCOUNTER — Ambulatory Visit (INDEPENDENT_AMBULATORY_CARE_PROVIDER_SITE_OTHER): Payer: Medicaid Other | Admitting: Psychiatry

## 2019-06-12 ENCOUNTER — Other Ambulatory Visit: Payer: Self-pay

## 2019-06-12 ENCOUNTER — Encounter (HOSPITAL_COMMUNITY): Payer: Self-pay | Admitting: Psychiatry

## 2019-06-12 DIAGNOSIS — F4312 Post-traumatic stress disorder, chronic: Secondary | ICD-10-CM

## 2019-06-12 DIAGNOSIS — F331 Major depressive disorder, recurrent, moderate: Secondary | ICD-10-CM

## 2019-06-12 NOTE — Progress Notes (Signed)
Virtual Visit via Video Note  I connected with Thornton Dales on 06/12/19 at  1:00 PM EST by a video enabled telemedicine application and verified that I am speaking with the correct person using two identifiers.  Location: Patient: Lynn [name] Provider: Lise Auer, LCSW   I discussed the limitations of evaluation and management by telemedicine and the availability of in person appointments. The patient expressed understanding and agreed to proceed.  History of Present Illness: MDD and PTSD   Observations/Objective: Counselor met with Kathleena for individual therapy via Webex. Counselor assessed MH symptoms and progress on treatment plan goals.Pearle presents with mild depression and moderate anxiety. Audray denied suicidal ideation or self-harm behaviors. Latondra shared that this morning she experienced chest pains, passed out and fell. Counselor assessed symptoms to rule out a panic attack and recommended Mylo to contact her provider directly after the session to be assessed for potential heart attack or cardiac related issue. Bonni also mentioned injuring her arm in the fall. Camree agreed to contact provider. Counselor assessed daily functioning. Counselor continues to be concerned with her food and fluid intake. Lailoni is showing improvements in socialization, self-esteem, self-confidence, self-advocacy, communication skills, and improved depression. Shaletha noted improvement in PTSD symptoms. Counselor processed thoughts and feelings related to progress in relationship with family members, identifying application of coping skills and additional coping strategies to consider. Counselor and Denette discussed discharge planning and upcoming needs.    Assessment and Plan: Counselor will continue to meet with patient to address treatment plan goals. Patient will continue to follow recommendations of providers and implement skills learned in session.  Follow Up  Instructions: Counselor will send information for next session via Webex.     I discussed the assessment and treatment plan with the patient. The patient was provided an opportunity to ask questions and all were answered. The patient agreed with the plan and demonstrated an understanding of the instructions.   The patient was advised to call back or seek an in-person evaluation if the symptoms worsen or if the condition fails to improve as anticipated.  I provided 60 minutes of non-face-to-face time during this encounter.   Lise Auer, LCSW

## 2019-06-13 ENCOUNTER — Ambulatory Visit (INDEPENDENT_AMBULATORY_CARE_PROVIDER_SITE_OTHER): Payer: Medicaid Other | Admitting: Psychiatry

## 2019-06-13 ENCOUNTER — Other Ambulatory Visit: Payer: Self-pay

## 2019-06-13 ENCOUNTER — Encounter (HOSPITAL_COMMUNITY): Payer: Self-pay | Admitting: Psychiatry

## 2019-06-13 DIAGNOSIS — F4312 Post-traumatic stress disorder, chronic: Secondary | ICD-10-CM

## 2019-06-13 DIAGNOSIS — F331 Major depressive disorder, recurrent, moderate: Secondary | ICD-10-CM | POA: Diagnosis not present

## 2019-06-13 MED ORDER — LURASIDONE HCL 40 MG PO TABS: 40.0000 mg | ORAL_TABLET | Freq: Every day | ORAL | 1 refills | Status: DC

## 2019-06-13 MED ORDER — FLUOXETINE HCL 20 MG PO CAPS: 20.0000 mg | ORAL_CAPSULE | Freq: Every day | ORAL | 1 refills | Status: DC
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
## 2019-06-27 ENCOUNTER — Ambulatory Visit (HOSPITAL_COMMUNITY): Payer: Medicaid Other | Admitting: Psychiatry

## 2019-06-27 ENCOUNTER — Other Ambulatory Visit: Payer: Self-pay

## 2019-06-27 NOTE — Progress Notes (Signed)
Vi

## 2019-07-18 ENCOUNTER — Encounter (HOSPITAL_COMMUNITY): Payer: Self-pay | Admitting: Psychiatry

## 2019-07-18 ENCOUNTER — Other Ambulatory Visit: Payer: Self-pay

## 2019-07-18 ENCOUNTER — Ambulatory Visit (HOSPITAL_COMMUNITY): Payer: Medicaid Other | Admitting: Psychiatry

## 2019-07-18 NOTE — Progress Notes (Signed)
Counselor sent the link for the televisit and a reminder, however the client never logged onto the session. Counselor called [name] to leave a voicemail letting her know how to access the session, however, [name] never called back or logged on to the televisit. Today was our last scheduled appointment. Lynn [name] will continue seeing her primary care and associated social worker for follow- up care, in addition to her psychiatrist with this practice.   Hilbert Odor, LCSW 07/18/19

## 2019-08-09 ENCOUNTER — Encounter (HOSPITAL_COMMUNITY): Payer: Self-pay | Admitting: Psychiatry

## 2019-08-09 ENCOUNTER — Other Ambulatory Visit: Payer: Self-pay

## 2019-08-09 ENCOUNTER — Ambulatory Visit (INDEPENDENT_AMBULATORY_CARE_PROVIDER_SITE_OTHER): Payer: Medicaid Other | Admitting: Psychiatry

## 2019-08-09 DIAGNOSIS — F4312 Post-traumatic stress disorder, chronic: Secondary | ICD-10-CM

## 2019-08-09 DIAGNOSIS — F331 Major depressive disorder, recurrent, moderate: Secondary | ICD-10-CM | POA: Diagnosis not present

## 2019-08-09 MED ORDER — LURASIDONE HCL 40 MG PO TABS: 40.0000 mg | ORAL_TABLET | Freq: Every day | ORAL | 2 refills | Status: DC

## 2019-08-09 MED ORDER — FLUOXETINE HCL 20 MG PO CAPS: 20.0000 mg | ORAL_CAPSULE | Freq: Every day | ORAL | 2 refills | Status: DC
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
## 2019-11-08 ENCOUNTER — Ambulatory Visit (HOSPITAL_COMMUNITY): Payer: Medicaid Other | Admitting: Psychiatry

## 2019-11-15 ENCOUNTER — Encounter (HOSPITAL_COMMUNITY): Payer: Self-pay | Admitting: Psychiatry

## 2019-11-15 ENCOUNTER — Telehealth (INDEPENDENT_AMBULATORY_CARE_PROVIDER_SITE_OTHER): Payer: Medicaid Other | Admitting: Psychiatry

## 2019-11-15 ENCOUNTER — Other Ambulatory Visit: Payer: Self-pay

## 2019-11-15 DIAGNOSIS — F331 Major depressive disorder, recurrent, moderate: Secondary | ICD-10-CM | POA: Diagnosis not present

## 2019-11-15 DIAGNOSIS — F4312 Post-traumatic stress disorder, chronic: Secondary | ICD-10-CM | POA: Diagnosis not present

## 2019-11-15 MED ORDER — LURASIDONE HCL 40 MG PO TABS: 40.0000 mg | ORAL_TABLET | Freq: Every day | ORAL | 2 refills | Status: DC

## 2019-11-15 MED ORDER — FLUOXETINE HCL 20 MG PO CAPS: 20.0000 mg | ORAL_CAPSULE | Freq: Every day | ORAL | 2 refills | Status: DC
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
## 2020-01-31 ENCOUNTER — Encounter (HOSPITAL_COMMUNITY): Payer: Self-pay | Admitting: Psychiatry

## 2020-02-13 ENCOUNTER — Encounter (HOSPITAL_COMMUNITY): Payer: Self-pay | Admitting: Psychiatry

## 2020-02-13 ENCOUNTER — Telehealth (INDEPENDENT_AMBULATORY_CARE_PROVIDER_SITE_OTHER): Payer: Medicaid Other | Admitting: Psychiatry

## 2020-02-13 ENCOUNTER — Other Ambulatory Visit: Payer: Self-pay

## 2020-02-13 DIAGNOSIS — F4312 Post-traumatic stress disorder, chronic: Secondary | ICD-10-CM

## 2020-02-13 DIAGNOSIS — F331 Major depressive disorder, recurrent, moderate: Secondary | ICD-10-CM | POA: Diagnosis not present

## 2020-02-13 MED ORDER — LURASIDONE HCL 40 MG PO TABS: 40.0000 mg | ORAL_TABLET | Freq: Every day | ORAL | 2 refills | Status: DC

## 2020-02-13 MED ORDER — FLUOXETINE HCL 20 MG PO CAPS: 20.0000 mg | ORAL_CAPSULE | Freq: Every day | ORAL | 2 refills | Status: DC
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
## 2020-05-13 ENCOUNTER — Telehealth (INDEPENDENT_AMBULATORY_CARE_PROVIDER_SITE_OTHER): Payer: Medicaid Other | Admitting: Psychiatry

## 2020-05-13 ENCOUNTER — Encounter (HOSPITAL_COMMUNITY): Payer: Self-pay | Admitting: Psychiatry

## 2020-05-13 ENCOUNTER — Other Ambulatory Visit: Payer: Self-pay

## 2020-05-13 DIAGNOSIS — F4312 Post-traumatic stress disorder, chronic: Secondary | ICD-10-CM | POA: Diagnosis not present

## 2020-05-13 DIAGNOSIS — F331 Major depressive disorder, recurrent, moderate: Secondary | ICD-10-CM

## 2020-05-13 MED ORDER — FLUOXETINE HCL 20 MG PO CAPS: 20.0000 mg | ORAL_CAPSULE | Freq: Every day | ORAL | 2 refills | Status: DC

## 2020-05-13 MED ORDER — LURASIDONE HCL 40 MG PO TABS: 40.0000 mg | ORAL_TABLET | Freq: Every day | ORAL | 2 refills | Status: DC
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
## 2020-08-06 ENCOUNTER — Telehealth (HOSPITAL_COMMUNITY): Payer: Medicaid Other | Admitting: Psychiatry

## 2020-08-06 ENCOUNTER — Other Ambulatory Visit: Payer: Self-pay

## 2020-08-07 ENCOUNTER — Telehealth (HOSPITAL_COMMUNITY): Payer: Medicaid Other | Admitting: Psychiatry

## 2020-08-09 ENCOUNTER — Telehealth (HOSPITAL_COMMUNITY): Payer: Self-pay

## 2020-08-09 NOTE — Telephone Encounter (Signed)
Received a fax from [facility] requesting a refill on patient's Lurasidone 40mg . Her last appointment with you was on 05/13/20 and it doesn't look like she kept her March appointments. I'm not sure whether you want her to make another one before you fill it or you want to fill it without a followup appointment? Please advise. Thank you

## 2020-08-12 NOTE — Telephone Encounter (Signed)
Needs appointment

## 2020-08-13 NOTE — Telephone Encounter (Signed)
Notified patient - LVM 

## 2020-08-13 NOTE — Telephone Encounter (Signed)
Notified patient that an appointment needs to be made (per doctor) before a refill can be sent in for her Latuda 40mg . Patient didn't pick up - LVM

## 2020-12-03 ENCOUNTER — Emergency Department: Payer: Medicaid Other

## 2020-12-03 ENCOUNTER — Other Ambulatory Visit: Payer: Self-pay

## 2020-12-03 ENCOUNTER — Emergency Department
Admission: EM | Admit: 2020-12-03 | Discharge: 2020-12-03 | Disposition: A | Discharge status: Home / Self Care | Payer: Medicaid Other | Attending: Emergency Medicine | Admitting: Emergency Medicine

## 2020-12-03 DIAGNOSIS — Z7984 Long term (current) use of oral hypoglycemic drugs: Secondary | ICD-10-CM | POA: Insufficient documentation

## 2020-12-03 DIAGNOSIS — Z79899 Other long term (current) drug therapy: Secondary | ICD-10-CM | POA: Insufficient documentation

## 2020-12-03 DIAGNOSIS — R079 Chest pain, unspecified: Secondary | ICD-10-CM

## 2020-12-03 DIAGNOSIS — R519 Headache, unspecified: Secondary | ICD-10-CM | POA: Insufficient documentation

## 2020-12-03 DIAGNOSIS — Z794 Long term (current) use of insulin: Secondary | ICD-10-CM | POA: Insufficient documentation

## 2020-12-03 DIAGNOSIS — I10 Essential (primary) hypertension: Secondary | ICD-10-CM | POA: Diagnosis not present

## 2020-12-03 DIAGNOSIS — E119 Type 2 diabetes mellitus without complications: Secondary | ICD-10-CM | POA: Diagnosis not present

## 2020-12-03 LAB — TROPONIN I (HIGH SENSITIVITY)
Troponin I (High Sensitivity): 6 ng/L (ref <18)
Troponin I (High Sensitivity): 6 ng/L (ref <18)

## 2020-12-03 LAB — CBC
HCT: 41 % (ref 36.0–46.0)
Hemoglobin: 13.3 g/dL (ref 12.0–15.0)
MCH: 32 pg (ref 26.0–34.0)
MCHC: 32.4 g/dL (ref 30.0–36.0)
MCV: 98.6 fL (ref 80.0–100.0)
Platelets: 188 10*3/uL (ref 150–400)
RBC: 4.16 MIL/uL (ref 3.87–5.11)
RDW: 14.2 % (ref 11.5–15.5)
WBC: 7.3 10*3/uL (ref 4.0–10.5)
nRBC: 0 % (ref 0.0–0.2)

## 2020-12-03 LAB — BASIC METABOLIC PANEL
Anion gap: 8 (ref 5–15)
BUN: 13 mg/dL (ref 6–20)
CO2: 26 mmol/L (ref 22–32)
Calcium: 9.1 mg/dL (ref 8.9–10.3)
Chloride: 109 mmol/L (ref 98–111)
Creatinine, Ser: 1.1 mg/dL — ABNORMAL HIGH (ref 0.44–1.00)
GFR, Estimated: 58 mL/min — ABNORMAL LOW (ref >60)
Glucose, Bld: 96 mg/dL (ref 70–99)
Potassium: 3.4 mmol/L — ABNORMAL LOW (ref 3.5–5.1)
Sodium: 143 mmol/L (ref 135–145)

## 2020-12-03 LAB — BRAIN NATRIURETIC PEPTIDE: B Natriuretic Peptide: 62.7 pg/mL (ref 0.0–100.0)

## 2020-12-03 NOTE — ED Notes (Signed)
PA at bedside.

## 2020-12-03 NOTE — ED Notes (Addendum)
Pt to ED for elevated BP. Took BP at home and it was 211/120 and had HA. Pt was taking several BP meds but was taken off meds by PCP (gradually decreasing over last year; completely off BP meds since 3 months ago). Pt thinks she may still take lisinopril but not sure.  Pt states that she had episode of blurry vision, diaphoresis and dizziness 3d ago. States has been having "palpitations" ongoing since 4d ago.   States is having dull CP since this morning. Mid chest, no radiation. Unable to give descriptors regarding CP.

## 2020-12-03 NOTE — ED Notes (Signed)
Pt to MRI

## 2020-12-03 NOTE — ED Triage Notes (Signed)
Pt comes with c/o HTN. Pt states she was on meds for her BP but was doing well so PCP took her off.  Pt states CP that started last night. Pt states this actually started Friday.

## 2020-12-03 NOTE — ED Notes (Signed)
Received call from Xray stating that pt c/o slurred speech and was stuttering with them. This RN asked pt about this and pt states this started Friday. Pt states I am stuttering.

## 2020-12-03 NOTE — ED Provider Notes (Signed)
[facility] Emergency Department Provider Note  ____________________________________________   Event Date/Time   First MD Initiated Contact with Patient 12/03/20 1213     (approximate)  I have reviewed the triage vital signs and the nursing notes.   HISTORY  Chief Complaint Hypertension  HPI Lynn [name] is a 60 y.o. female with the below medical history including hypertension, diabetes, and major depressive disorder, presents to the ED for evaluation of elevated blood pressures.  Patient reports that she has been on second blood pressure meds over the last several years, and has had them systematically reduced to just lisinopril over the last year.  Her blood pressures have been responding well to the primary care provider has been adjusting the patient's medicines.  She describes onset 2 nights ago, of some central chest pain.  She notes symptoms have been intermittent since that time.  Denies any associated nausea, diaphoresis, or syncope.  She also noted onset of this chest pain with a severe headache.  She denies any visual disturbance, tinnitus, or nausea associated with the headache.  She presents now for evaluation of some elevated blood pressures.  Past Medical History:  Diagnosis Date   Depression    Diabetes mellitus without complication (HCC)    Hypertension    Obesity     Patient Active Problem List   Diagnosis Date Noted   Severe major depression, single episode, without psychotic features (HCC) 11/02/2017   Diabetes mellitus without complication (HCC) 11/02/2017    Past Surgical History:  Procedure Laterality Date   CESAREAN SECTION      Prior to Admission medications   Medication Sig Start Date End Date Taking? Authorizing Provider  amLODipine (NORVASC) 10 MG tablet Take 10 mg by mouth daily.    [provider]  atorvastatin (LIPITOR) 10 MG tablet Take 10 mg by mouth daily.    [provider]  cloNIDine (CATAPRES)  0.3 MG tablet Take 0.3 mg by mouth 2 (two) times daily. Patient not taking: No sig reported    [provider]  Dulaglutide (TRULICITY) 0.75 MG/0.5ML SOPN Inject into the skin. 01/16/20 01/15/21  Wynelle Fanny, MD  empagliflozin (JARDIANCE) 10 MG TABS tablet Take 10 mg by mouth daily.    Evie Lacks, NP  FLUoxetine (PROZAC) 20 MG capsule Take 1 capsule (20 mg total) by mouth daily. 05/13/20   Arfeen, Phillips Grout, MD  gabapentin (NEURONTIN) 100 MG capsule Take by mouth. 01/16/20 01/15/21  Wynelle Fanny, MD  hydrALAZINE (APRESOLINE) 25 MG tablet  06/03/13   [provider]  hydrochlorothiazide (HYDRODIURIL) 25 MG tablet Take 25 mg by mouth daily.    [provider]  lisinopril (PRINIVIL,ZESTRIL) 20 MG tablet Take 20 mg by mouth daily.    [provider]  lurasidone (LATUDA) 40 MG TABS tablet Take 1 tablet (40 mg total) by mouth daily with breakfast. 05/13/20   Arfeen, Phillips Grout, MD    Allergies Liraglutide  History reviewed. No pertinent family history.  Social History Social History   Tobacco Use   Smoking status: Never   Smokeless tobacco: Never  Vaping Use   Vaping Use: Never used  Substance Use Topics   Alcohol use: No   Drug use: No    Review of Systems  Constitutional: No fever/chills Eyes: No visual changes. ENT: No sore throat. Cardiovascular: Reports chest pain. Respiratory: Denies shortness of breath. Gastrointestinal: No abdominal pain.  No nausea, no vomiting.  No diarrhea.  No constipation. Genitourinary: Negative for  dysuria. Musculoskeletal: Negative for back pain. Skin: Negative for rash. Neurological: Postive for headache as above. Denies focal weakness or numbness. ____________________________________________   PHYSICAL EXAM:  VITAL SIGNS: ED Triage Vitals [12/03/20 1121]  Enc Vitals Group     BP (!) 189/118     Pulse Rate 100     Resp 19     Temp 97.7 F (36.5 C)     Temp src      SpO2 98 %     Weight       Height      Head Circumference      Peak Flow      Pain Score 5     Pain Loc      Pain Edu?      Excl. in GC?     Constitutional: Alert and oriented. Well appearing and in no acute distress. Eyes: Conjunctivae are normal. PERRL. EOMI. normal fundi bilaterally Head: Atraumatic. Nose: No congestion/rhinnorhea. Mouth/Throat: Mucous membranes are moist.  Oropharynx non-erythematous. Neck: No stridor.   Cardiovascular: Normal rate, regular rhythm. Grossly normal heart sounds.  Good peripheral circulation. Respiratory: Normal respiratory effort.  No retractions. Lungs CTAB. Gastrointestinal: Soft and nontender. No distention. No abdominal bruits. No CVA tenderness. Musculoskeletal: No lower extremity tenderness nor edema.  No joint effusions. Neurologic:  Normal speech and language. No gross focal neurologic deficits are appreciated. No gait instability. Skin:  Skin is warm, dry and intact. No rash noted. Psychiatric: Mood and affect are normal. Speech and behavior are normal.  ____________________________________________   LABS (all labs ordered are listed, but only abnormal results are displayed)  Labs Reviewed  BASIC METABOLIC PANEL - Abnormal; Notable for the following components:      Result Value   Potassium 3.4 (*)    Creatinine, Ser 1.10 (*)    GFR, Estimated 58 (*)    All other components within normal limits  CBC  BRAIN NATRIURETIC PEPTIDE  TROPONIN I (HIGH SENSITIVITY)  TROPONIN I (HIGH SENSITIVITY)   ____________________________________________  EKG  NSR Vent. rate 92 BPM PR interval 148 ms QRS duration 82 ms QT/QTcB 386/477 ms P-R-T axes 60 -24 63 No STEMI ____________________________________________  RADIOLOGY I, Lissa Hoard, personally viewed and evaluated these images (plain radiographs) as part of my medical decision making, as well as reviewing the written report by the radiologist.  ED MD interpretation:  agree with reports  Official  radiology report(s): DG Chest 2 View  Result Date: 12/03/2020 CLINICAL DATA:  Chest pain. EXAM: CHEST - 2 VIEW COMPARISON:  06/13/2015. FINDINGS: Cardiomegaly with pulmonary venous congestion. Mild bilateral interstitial prominence. Mild interstitial edema cannot be excluded. Pneumonitis cannot be excluded. No pleural effusion or pneumothorax degenerative change thoracic spine. IMPRESSION: Cardiomegaly with mild pulmonary venous congestion. Mild bilateral interstitial prominence. Mild interstitial edema and/or pneumonitis cannot be excluded. Electronically Signed   By: Maisie Fus  Register   On: 12/03/2020 12:12   CT Head Wo Contrast  Result Date: 12/03/2020 CLINICAL DATA:  Slurred speech, hypertension EXAM: CT HEAD WITHOUT CONTRAST TECHNIQUE: Contiguous axial images were obtained from the base of the skull through the vertex without intravenous contrast. COMPARISON:  11/02/2017 FINDINGS: Brain: No evidence of acute infarction, hemorrhage, hydrocephalus, extra-axial collection or mass lesion/mass effect. Vascular: Atherosclerotic calcifications involving the large vessels of the skull base. No unexpected hyperdense vessel. Skull: Normal. Negative for fracture or focal lesion. Sinuses/Orbits: No acute finding. Other: None. IMPRESSION: No acute intracranial findings. Electronically Signed   By: Duanne Guess D.O.  On: 12/03/2020 12:28   MR BRAIN WO CONTRAST  Result Date: 12/03/2020 CLINICAL DATA:  Neuro deficit, acute stroke suspected. EXAM: MRI HEAD WITHOUT CONTRAST TECHNIQUE: Multiplanar, multiecho pulse sequences of the brain and surrounding structures were obtained without intravenous contrast. COMPARISON:  Same day CT head. FINDINGS: Brain: No acute infarction, hemorrhage, hydrocephalus, extra-axial collection or mass lesion. Suspected small remote lacunar infarcts in bilateral thalami (series 10, image 14). Partially empty sella. Vascular: Major arterial flow voids are maintained skull base. Skull and  upper cervical spine: Normal marrow signal. Sinuses/Orbits: Mild mucosal thickening.  Unremarkable orbits. Other: No sizable mastoid effusions. IMPRESSION: 1. No evidence of acute intracranial abnormality. Specifically, no acute infarct. 2. 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 3. Partially empty sella, which is often a normal anatomic variant but can be associated with idiopathic intracranial hypertension (pseudotumor cerebri). Electronically Signed   By: Feliberto Harts MD   On: 12/03/2020 14:25    ____________________________________________   PROCEDURES  Procedure(s) performed (including Critical Care):  Procedures   ____________________________________________  INITIAL IMPRESSION / ASSESSMENT AND PLAN / ED COURSE  As part of my medical decision making, I reviewed the following data within the electronic MEDICAL RECORD NUMBER Labs reviewed WNL, EKG interpreted NSR, Radiograph reviewed NAD, and Notes from prior ED visits   Differential diagnosis includes, but is not limited to, intracranial hemorrhage, meningitis/encephalitis, previous head trauma, cavernous venous thrombosis, tension headache, temporal arteritis, migraine or migraine equivalent, idiopathic intracranial hypertension, and non-specific headache.  Patient ED evaluation of elevated blood pressures with onset of slight headache.  She was evaluated for complaints in the ED with labs, CT, and MRI.  Patient's overall labs and diagnostic tests reassuring as it showed no acute findings and no acute intracranial process.  No lab electrolyte abnormalities are seen.  Patient's blood pressures have been trending downward since her course in the ED without any medical intervention.  Patient is currently endorsing 0 chest and her headache pain.  Patient's clinical picture is reassuring as it shows no acute MI or intracranial lesion.  Symptoms may represent a stress response and or recalcitrant hypertension.  Patient is  advised to follow with primary provider for reevaluation of blood pressure patient also encouraged to take daily blood pressure readings.  Return precautions have been discussed and acknowledged ____________________________________________   FINAL CLINICAL IMPRESSION(S) / ED DIAGNOSES  Final diagnoses:  Primary hypertension  Chest pain, unspecified type     ED Discharge Orders     None        Note:  This document was prepared using Dragon voice recognition software and may include unintentional dictation errors.    Lissa Hoard, PA-C 12/03/20 1627    Minna Antis, MD 12/04/20 2202

## 2020-12-03 NOTE — Discharge Instructions (Addendum)
Your exam and labs are normal at this time as well as a normal head CT and MRI.  You should monitor your blood pressures at home, and follow-up with your primary provider for ongoing management.  Return to the ED for worsening symptoms as discussed.

## 2020-12-03 NOTE — ED Notes (Signed)
Pt in CT. CT called and informed to please bring pt to room once they are done with their test

## 2021-02-07 DIAGNOSIS — M1712 Unilateral primary osteoarthritis, left knee: Secondary | ICD-10-CM | POA: Insufficient documentation

## 2021-02-10 DIAGNOSIS — M6281 Muscle weakness (generalized): Secondary | ICD-10-CM | POA: Insufficient documentation

## 2021-02-10 DIAGNOSIS — M25562 Pain in left knee: Secondary | ICD-10-CM | POA: Insufficient documentation

## 2021-03-03 ENCOUNTER — Telehealth (HOSPITAL_BASED_OUTPATIENT_CLINIC_OR_DEPARTMENT_OTHER): Payer: Medicaid Other | Admitting: Psychiatry

## 2021-03-03 ENCOUNTER — Encounter (HOSPITAL_COMMUNITY): Payer: Self-pay | Admitting: Psychiatry

## 2021-03-03 ENCOUNTER — Other Ambulatory Visit: Payer: Self-pay

## 2021-03-03 DIAGNOSIS — F4312 Post-traumatic stress disorder, chronic: Secondary | ICD-10-CM | POA: Diagnosis not present

## 2021-03-03 DIAGNOSIS — F331 Major depressive disorder, recurrent, moderate: Secondary | ICD-10-CM

## 2021-03-03 MED ORDER — FLUOXETINE HCL 20 MG PO CAPS: 20.0000 mg | ORAL_CAPSULE | Freq: Every day | ORAL | 2 refills | Status: DC

## 2021-03-03 MED ORDER — LURASIDONE HCL 40 MG PO TABS: 40.0000 mg | ORAL_TABLET | Freq: Every day | ORAL | 2 refills | Status: DC
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
## 2021-03-11 ENCOUNTER — Encounter (HOSPITAL_COMMUNITY): Payer: Self-pay

## 2021-06-03 ENCOUNTER — Other Ambulatory Visit: Payer: Self-pay

## 2021-06-03 ENCOUNTER — Telehealth (HOSPITAL_BASED_OUTPATIENT_CLINIC_OR_DEPARTMENT_OTHER): Payer: Medicaid Other | Admitting: Psychiatry

## 2021-06-03 ENCOUNTER — Encounter (HOSPITAL_COMMUNITY): Payer: Self-pay | Admitting: Psychiatry

## 2021-06-03 DIAGNOSIS — F331 Major depressive disorder, recurrent, moderate: Secondary | ICD-10-CM

## 2021-06-03 DIAGNOSIS — F4312 Post-traumatic stress disorder, chronic: Secondary | ICD-10-CM

## 2021-06-03 MED ORDER — FLUOXETINE HCL 20 MG PO CAPS: 20.0000 mg | ORAL_CAPSULE | Freq: Every day | ORAL | 2 refills | Status: DC

## 2021-06-03 MED ORDER — LURASIDONE HCL 40 MG PO TABS: 40.0000 mg | ORAL_TABLET | Freq: Every day | ORAL | 2 refills | Status: DC
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
## 2021-06-17 ENCOUNTER — Telehealth (HOSPITAL_COMMUNITY): Payer: Self-pay

## 2021-06-17 NOTE — Telephone Encounter (Signed)
[employer]S PRESCRIPTION COVERAGE APPROVED  LATUDA 40MG  TABLET PA # [id number] EFFECTIVE 06/16/2021 TO 06/11/2022  S/W MICHELLE  NOTIFIED PHARMACY/PT

## 2021-08-20 DIAGNOSIS — M25512 Pain in left shoulder: Secondary | ICD-10-CM | POA: Insufficient documentation

## 2021-09-01 ENCOUNTER — Encounter (HOSPITAL_COMMUNITY): Payer: Self-pay | Admitting: Psychiatry

## 2021-09-01 ENCOUNTER — Telehealth (HOSPITAL_BASED_OUTPATIENT_CLINIC_OR_DEPARTMENT_OTHER): Payer: No Typology Code available for payment source | Admitting: Psychiatry

## 2021-09-01 DIAGNOSIS — F331 Major depressive disorder, recurrent, moderate: Secondary | ICD-10-CM

## 2021-09-01 DIAGNOSIS — F4312 Post-traumatic stress disorder, chronic: Secondary | ICD-10-CM

## 2021-09-01 MED ORDER — FLUOXETINE HCL 20 MG PO CAPS: 20.0000 mg | ORAL_CAPSULE | Freq: Every day | ORAL | 2 refills | Status: DC

## 2021-09-01 MED ORDER — LURASIDONE HCL 40 MG PO TABS: 40.0000 mg | ORAL_TABLET | Freq: Every day | ORAL | 2 refills | Status: DC
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
## 2021-11-09 DIAGNOSIS — M25511 Pain in right shoulder: Secondary | ICD-10-CM | POA: Insufficient documentation

## 2021-11-26 ENCOUNTER — Telehealth (HOSPITAL_COMMUNITY): Payer: Self-pay | Admitting: *Deleted

## 2021-11-26 NOTE — Telephone Encounter (Signed)
I spoke to the the pharmacy staff person Clenton Pare who reported that medicine went through and there is no concern.

## 2021-11-26 NOTE — Telephone Encounter (Signed)
Received a VM from pt's pharmacy [facility] requesting a call from provider regarding the Latuda 40 mg to discuss "document safety". Pharmacist cell # [phone]. The pharmacy, or center's # is [phone].

## 2021-11-26 NOTE — Telephone Encounter (Signed)
Good.

## 2021-12-01 ENCOUNTER — Telehealth (HOSPITAL_BASED_OUTPATIENT_CLINIC_OR_DEPARTMENT_OTHER): Payer: No Typology Code available for payment source | Admitting: Psychiatry

## 2021-12-01 ENCOUNTER — Encounter (HOSPITAL_COMMUNITY): Payer: Self-pay | Admitting: Psychiatry

## 2021-12-01 DIAGNOSIS — F331 Major depressive disorder, recurrent, moderate: Secondary | ICD-10-CM

## 2021-12-01 DIAGNOSIS — F4312 Post-traumatic stress disorder, chronic: Secondary | ICD-10-CM | POA: Diagnosis not present

## 2021-12-01 MED ORDER — FLUOXETINE HCL 20 MG PO CAPS: 20.0000 mg | ORAL_CAPSULE | Freq: Every day | ORAL | 2 refills | Status: DC

## 2021-12-01 MED ORDER — LURASIDONE HCL 40 MG PO TABS: 40.0000 mg | ORAL_TABLET | Freq: Every day | ORAL | 2 refills | Status: DC
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
## 2021-12-25 ENCOUNTER — Ambulatory Visit: Payer: Self-pay | Admitting: Licensed Clinical Social Worker

## 2021-12-25 ENCOUNTER — Ambulatory Visit (INDEPENDENT_AMBULATORY_CARE_PROVIDER_SITE_OTHER): Payer: No Typology Code available for payment source | Admitting: Licensed Clinical Social Worker

## 2021-12-25 DIAGNOSIS — F331 Major depressive disorder, recurrent, moderate: Secondary | ICD-10-CM

## 2021-12-25 DIAGNOSIS — F4312 Post-traumatic stress disorder, chronic: Secondary | ICD-10-CM
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
## 2021-12-26 ENCOUNTER — Encounter (HOSPITAL_COMMUNITY): Payer: Self-pay

## 2021-12-26 NOTE — Plan of Care (Signed)
Developed tx plan based on pt self reported input  

## 2022-01-27 ENCOUNTER — Ambulatory Visit (INDEPENDENT_AMBULATORY_CARE_PROVIDER_SITE_OTHER): Payer: No Typology Code available for payment source | Admitting: Licensed Clinical Social Worker

## 2022-01-27 DIAGNOSIS — F331 Major depressive disorder, recurrent, moderate: Secondary | ICD-10-CM | POA: Diagnosis not present
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
## 2022-01-30 NOTE — Plan of Care (Signed)
  Problem: Depression  Goal:  Decrease depressive symptoms and improve levels of effective functioning-pt reports a decrease in overall depression symptoms 3 out of 5 sessions documented.  Outcome: Progressing Goal: Develop healthy thinking patterns and beliefs about self, others, and the world that lead to the alleviation and help prevent the relapse of depression per self report 3 out of 5 sessions documented.   Outcome: Progressing Intervention: REVIEW PLEASE SKILLS (TREAT PHYSICAL ILLNESS, BALANCE EATING, AVOID MOOD-ALTERING SUBSTANCES, BALANCE SLEEP AND GET EXERCISE) WITH Margaretha Glassing Note: Reviewed  Intervention: Discuss self-management skills Note: Allowed pt to explore conflicts and discussed pros and cons of big life decisions Intervention: Assist with coping skills and behavior Note: Reviewed

## 2022-02-18 IMAGING — MR MR HEAD W/O CM
12 series · 48 of 48 positions shown · non-contrast
Comparison: Same day CT head.

CLINICAL DATA: Neuro deficit, acute stroke suspected.

EXAM:
MRI HEAD WITHOUT CONTRAST
TECHNIQUE: Multiplanar, multiecho pulse sequences of the brain and surrounding
structures were obtained without intravenous contrast.

[Series 5: ax dwi_tracew · axial · 3.0mm · 0.65mm/px · z∈[-127,+27]mm · 4 of 48 slices shown]
[im 1/48]
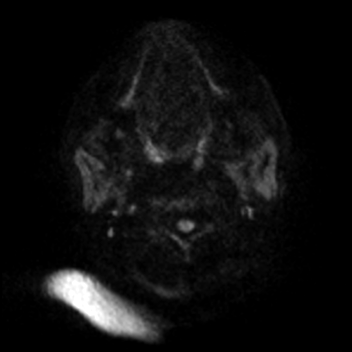
[im 16/48]
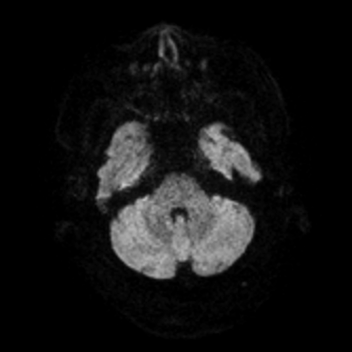
[im 32/48]
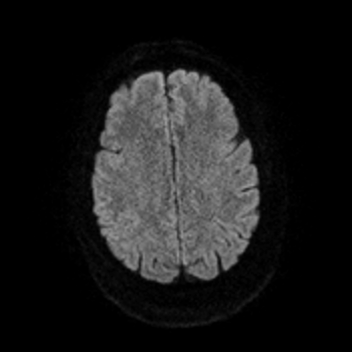
[im 48/48]
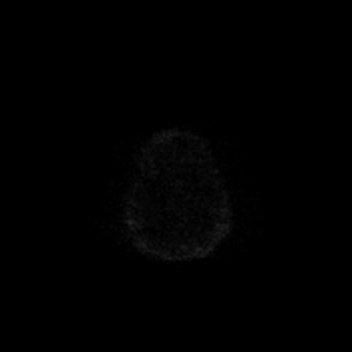

[Series 6: ax dwi_adc · axial · 3.0mm · 0.65mm/px · z∈[-127,+27]mm · 3 of 48 slices shown]
[im 1/48]
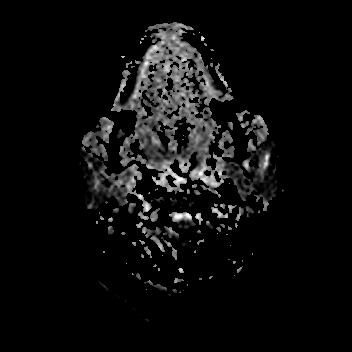
[im 24/48]
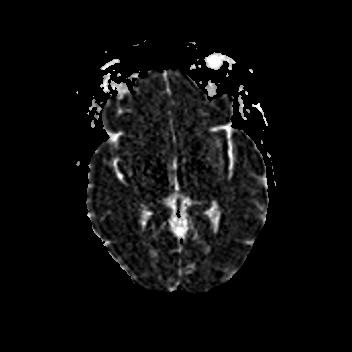
[im 48/48]
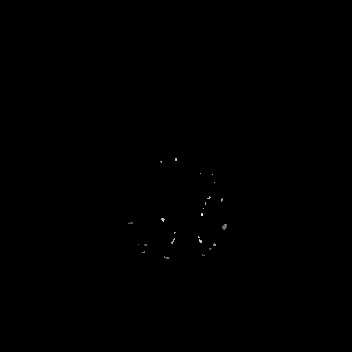

[Series 7: cor dwi_tracew · coronal · 5.0mm · 0.65mm/px · 3 of 40 slices shown]
[im 1/40]
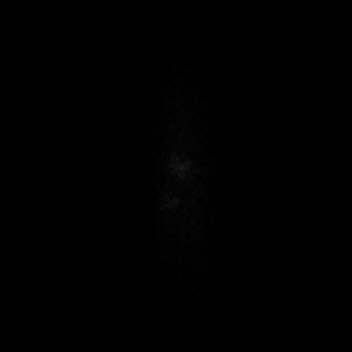
[im 20/40]
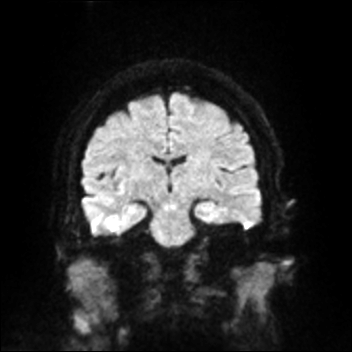
[im 40/40]
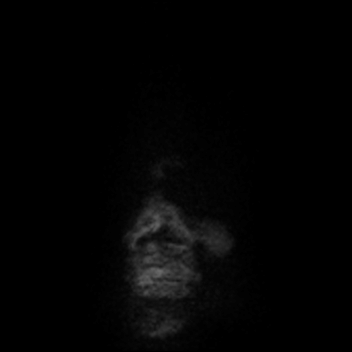

[Series 8: cor dwi_adc · coronal · 5.0mm · 0.65mm/px · 3 of 38 slices shown]
[im 1/38]
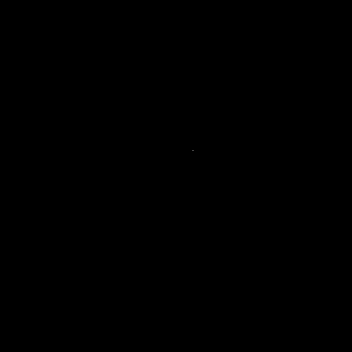
[im 19/38]
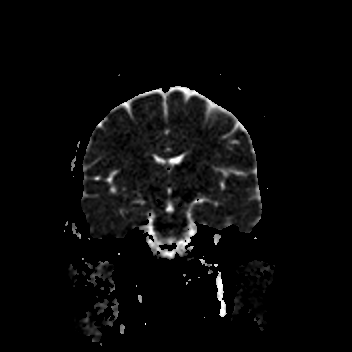
[im 38/38]
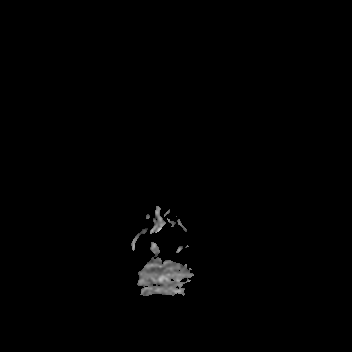

[Series 9: T1 · sagittal · 5.0mm · 0.62mm/px · 2 of 25 slices shown (1 of 2)]
[im 1/25]
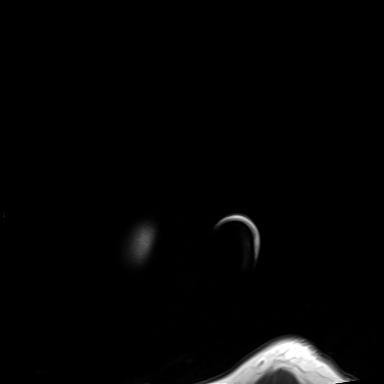
[im 25/25]
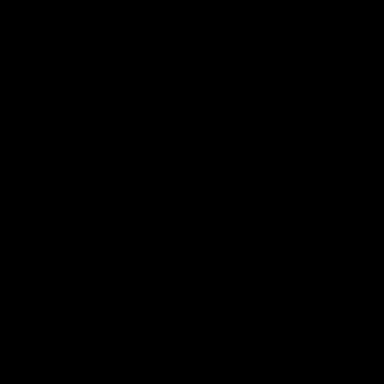

[Series 10: T2 · axial · 5.0mm · 0.53mm/px · z∈[-121,+23]mm · 2 of 25 slices shown (1 of 2)]
[im 1/25]
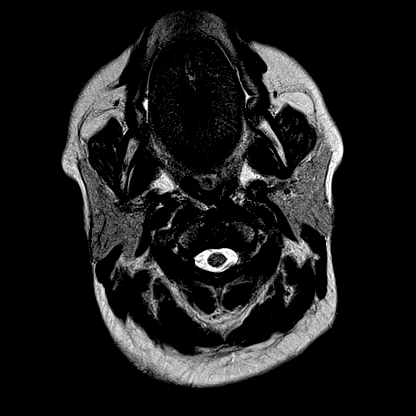
[im 25/25]
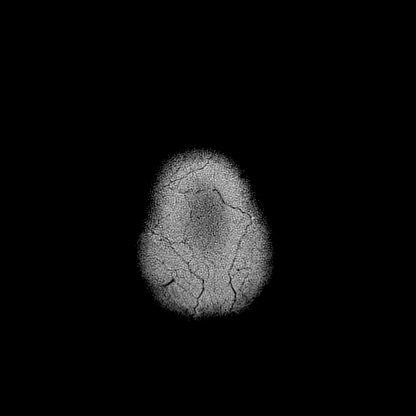

[Series 11: mag_images · axial · 3.0mm · 0.90mm/px · z∈[-137,+40]mm · 4 of 60 slices shown]
[im 1/60]
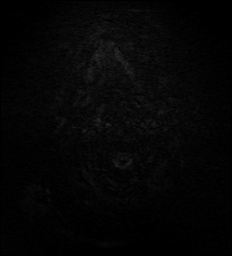
[im 20/60]
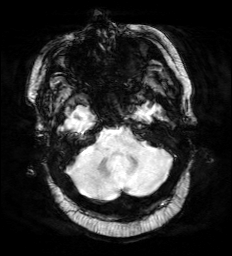
[im 40/60]
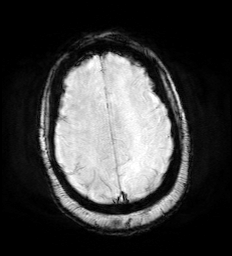
[im 60/60]
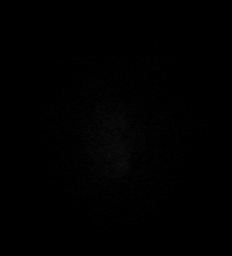

[Series 12: pha_images · axial · 3.0mm · 0.90mm/px · z∈[-137,+40]mm · 4 of 60 slices shown]
[im 1/60]
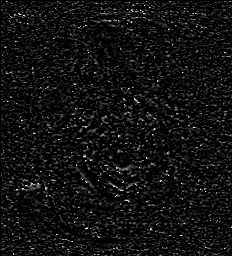
[im 20/60]
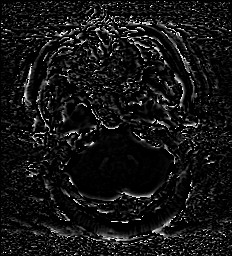
[im 40/60]
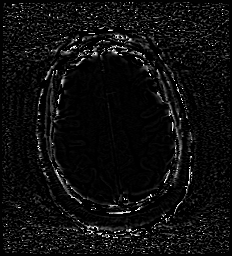
[im 60/60]
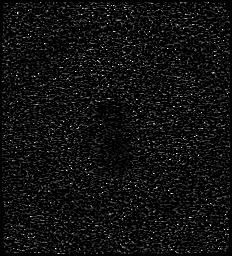

[Series 13: swi_images · axial · 3.0mm · 0.90mm/px · z∈[-137,+40]mm · 4 of 60 slices shown]
[im 1/60]
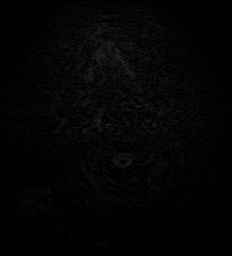
[im 20/60]
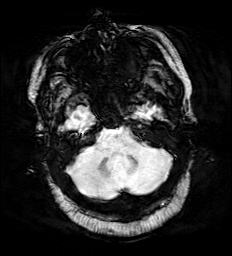
[im 40/60]
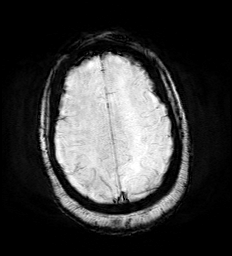
[im 60/60]
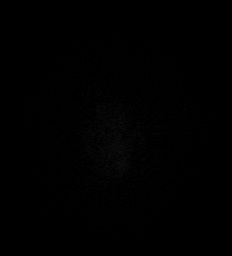

[Series 15: FLAIR · axial · 3.0mm · 0.53mm/px · z∈[-130,+32]mm · 4 of 55 slices shown]
[im 1/55]
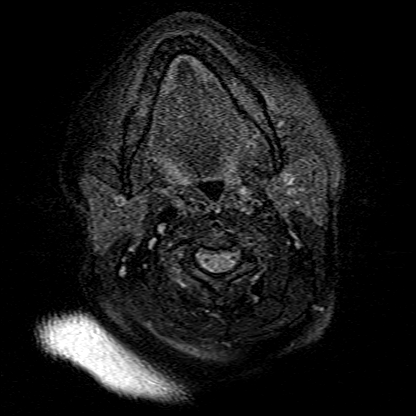
[im 19/55]
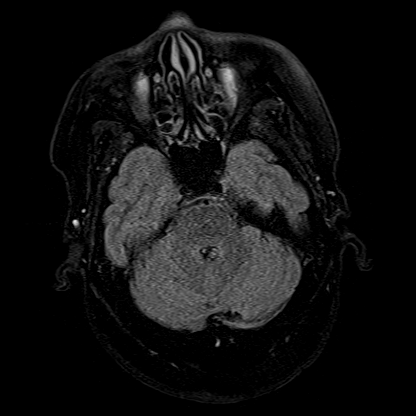
[im 37/55]
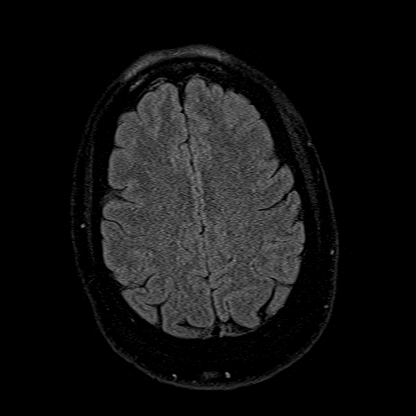
[im 55/55]
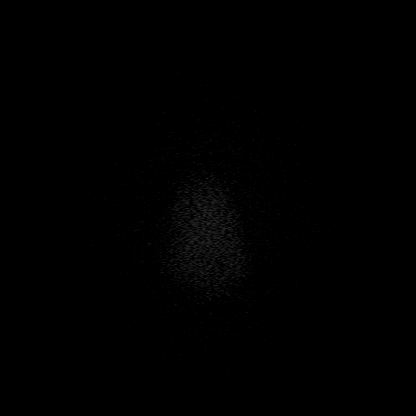

[Series 16: T1 · axial · 1.0mm · 0.98mm/px · z∈[-132,+41]mm · 13 of 176 slices shown (2 of 2)]
[im 1/176]
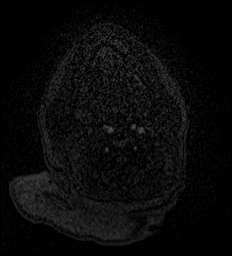
[im 15/176]
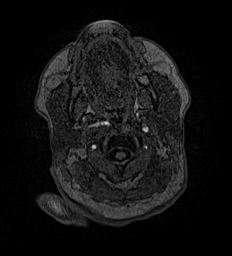
[im 30/176]
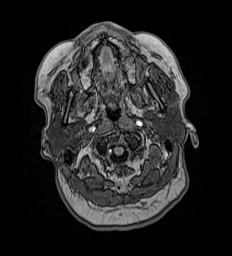
[im 44/176]
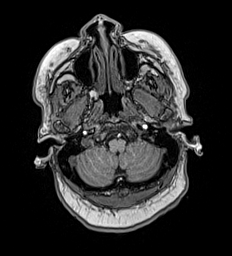
[im 59/176]
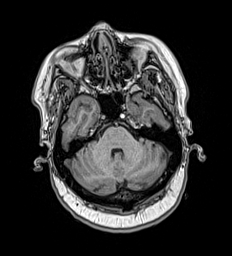
[im 73/176]
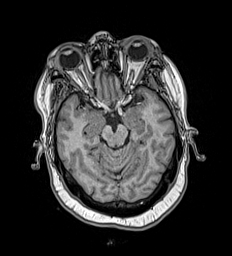
[im 88/176]
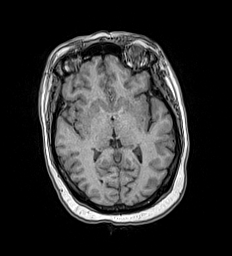
[im 103/176]
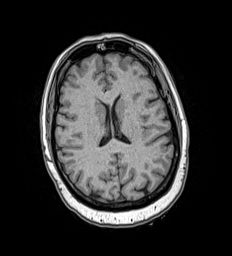
[im 117/176]
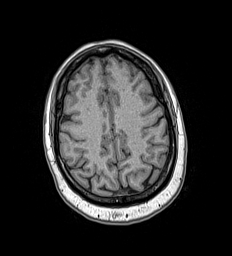
[im 132/176]
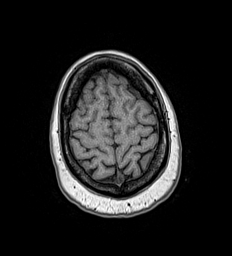
[im 146/176]
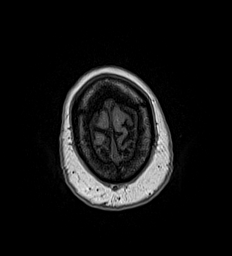
[im 161/176]
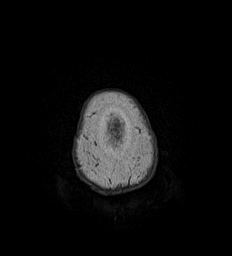
[im 176/176]
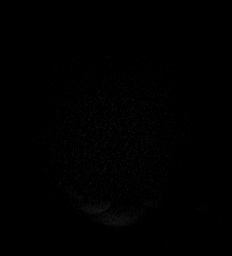

[Series 17: T2 · coronal · 5.0mm · 0.57mm/px · 2 of 29 slices shown (2 of 2)]
[im 1/29]
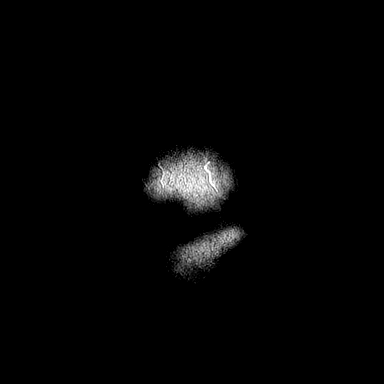
[im 29/29]
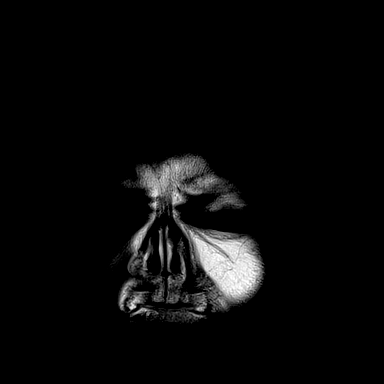

[48 of 48 positions shown; findings below may reference images not displayed]

FINDINGS: Brain: No acute infarction, hemorrhage, hydrocephalus, extra-axial
collection or mass lesion. Suspected small remote lacunar infarcts
in bilateral thalami (series 10, image 14). Partially empty sella.

Vascular: Major arterial flow voids are maintained skull base.

Skull and upper cervical spine: Normal marrow signal.

Sinuses/Orbits: Mild mucosal thickening.  Unremarkable orbits.

Other: No sizable mastoid effusions.
IMPRESSION: 1. No evidence of acute intracranial abnormality. Specifically, no
acute infarct.
2. Suspected small remote lacunar infarcts in bilateral thalami.
3. Partially empty sella, which is often a normal anatomic variant
but can be associated with idiopathic intracranial hypertension
(pseudotumor cerebri).

## 2022-03-03 ENCOUNTER — Telehealth (HOSPITAL_BASED_OUTPATIENT_CLINIC_OR_DEPARTMENT_OTHER): Payer: No Typology Code available for payment source | Admitting: Psychiatry

## 2022-03-03 ENCOUNTER — Encounter (HOSPITAL_COMMUNITY): Payer: Self-pay | Admitting: Psychiatry

## 2022-03-03 DIAGNOSIS — F4312 Post-traumatic stress disorder, chronic: Secondary | ICD-10-CM

## 2022-03-03 DIAGNOSIS — F331 Major depressive disorder, recurrent, moderate: Secondary | ICD-10-CM

## 2022-03-03 MED ORDER — FLUOXETINE HCL 20 MG PO CAPS: 20.0000 mg | ORAL_CAPSULE | Freq: Every day | ORAL | 2 refills | Status: DC

## 2022-03-03 MED ORDER — LURASIDONE HCL 40 MG PO TABS: 40.0000 mg | ORAL_TABLET | Freq: Every day | ORAL | 2 refills | Status: DC
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
## 2022-03-17 ENCOUNTER — Ambulatory Visit (HOSPITAL_COMMUNITY): Payer: No Typology Code available for payment source | Admitting: Licensed Clinical Social Worker

## 2022-03-18 ENCOUNTER — Ambulatory Visit (INDEPENDENT_AMBULATORY_CARE_PROVIDER_SITE_OTHER): Payer: No Typology Code available for payment source | Admitting: Licensed Clinical Social Worker

## 2022-03-18 DIAGNOSIS — F331 Major depressive disorder, recurrent, moderate: Secondary | ICD-10-CM

## 2022-03-18 DIAGNOSIS — F4312 Post-traumatic stress disorder, chronic: Secondary | ICD-10-CM | POA: Diagnosis not present

## 2022-03-18 NOTE — Progress Notes (Incomplete)
Virtual Visit via Audio Note  I connected with Lynn [name] on 03/18/22 at  3:00 PM EDT by an audio enabled telemedicine application and verified that I am speaking with the correct person using two identifiers.  Video connection was lost when less than 50% of the duration of the visit was complete, at which time the remainder of the visit was completed via audio only.  Location: Patient: home Provider: [place] Office    I discussed the limitations of evaluation and management by telemedicine and the availability of in person appointments. The patient expressed understanding and agreed to proceed.  I discussed the assessment and treatment plan with the patient. The patient was provided an opportunity to ask questions and all were answered. The patient agreed with the plan and demonstrated an understanding of the instructions.   The patient was advised to call back or seek an in-person evaluation if the symptoms worsen or if the condition fails to improve as anticipated.  I provided 45 minutes of non-face-to-face time during this encounter.   Lynn [name] Lynn Pharoah Goggins, LCSW   THERAPIST PROGRESS NOTE  Session Time: 3-345p  Participation Level: Active  Behavioral Response: NeatAlertAnxious  Type of Therapy: Individual Therapy  Treatment Goals addressed: PProblem: Depression  Goal:  Decrease depressive symptoms and improve levels of effective functioning-pt reports a decrease in overall depression symptoms 3 out of 5 sessions documented.  Outcome: Progressing Goal: Develop healthy thinking patterns and beliefs about self, others, and the world that lead to the alleviation and help prevent the relapse of depression per self report 3 out of 5 sessions documented.   Outcome: Progressing Intervention: REVIEW PLEASE SKILLS (TREAT PHYSICAL ILLNESS, BALANCE EATING, AVOID MOOD-ALTERING SUBSTANCES, BALANCE SLEEP AND GET EXERCISE) WITH [name] Note: Reviewed   Intervention: Discuss self-management skills Note: Discussed problem solving skills (weighing out pros/cons) Intervention: Assist with coping skills and behavior Note: Reviewed    Problem: Depression  Goal:  Decrease depressive symptoms and improve levels of effective functioning-pt reports a decrease in overall depression symptoms 3 out of 5 sessions documented.  Outcome: Progressing Goal: Develop healthy thinking patterns and beliefs about self, others, and the world that lead to the alleviation and help prevent the relapse of depression per self report 3 out of 5 sessions documented.   Outcome: Progressing Intervention: REVIEW PLEASE SKILLS (TREAT PHYSICAL ILLNESS, BALANCE EATING, AVOID MOOD-ALTERING SUBSTANCES, BALANCE SLEEP AND GET EXERCISE) WITH Lynn [name] Note: Reviewed  Intervention: Discuss self-management skills Note: Discussed problem solving skills (weighing out pros/cons) Intervention: Assist with coping skills and behavior Note: Reviewed     ProgressTowards Goals: Progressing  Interventions: CBT, Motivational Interviewing, and Supportive  Summary: Lynn [name] is a 61 y.o. female who presents with improving symptoms related to depression diagnosis. Pt reports that her overall mood is stable and that she is compliant with her medication.   Allowed pt to explore and express thoughts and feelings associated with recent life situations and external stressors. Pt reports that she now has a dog but is unsure whether she really wants a dog or not "Its a lot to deal with".  Pt states that she goes to church every Sunday in [place], and sometimes will spend the night there. Pt states that having a dog is a barrier that she did not see in regards to her weekly routine of going to [place] and the church. Pt states that she doesn't want a pet to interfere with her overall quality of life/spiritual life/engagement with the church.  Encouraged pt to  focus on the pros and cons  before making a definitive decision. Pt reflects understanding.  Explored relationships with close friends and family members.   Continued recommendations are as follows: self care behaviors, positive social engagements, focusing on overall work/home/life balance, and focusing on positive physical and emotional wellness.    Suicidal/Homicidal: No  Therapist Response: Pt is continuing to apply interventions learned in session into daily life situations. Pt is currently on track to meet goals utilizing interventions mentioned above. Personal growth and progress noted. Treatment to continue as indicated.   Plan: Return again in 4 weeks.  Diagnosis:  Encounter Diagnoses  Name Primary?   Moderate episode of recurrent major depressive disorder (HCC) Yes   Chronic post-traumatic stress disorder (PTSD)     Collaboration of Care: Other Pt to continue care with psychiatrist of record, Dr. Berniece [name]  Patient/Guardian was advised Release of Information must be obtained prior to any record release in order to collaborate their care with an outside provider. Patient/Guardian was advised if they have not already done so to contact the registration department to sign all necessary forms in order for [place] to release information regarding their care.   Consent: Patient/Guardian gives verbal consent for treatment and assignment of benefits for services provided during this visit. Patient/Guardian expressed understanding and agreed to proceed.   [place], LCSW 03/18/2022

## 2022-03-19 NOTE — Plan of Care (Signed)
  Problem: Depression  Goal:  Decrease depressive symptoms and improve levels of effective functioning-pt reports a decrease in overall depression symptoms 3 out of 5 sessions documented.  Outcome: Progressing Goal: Develop healthy thinking patterns and beliefs about self, others, and the world that lead to the alleviation and help prevent the relapse of depression per self report 3 out of 5 sessions documented.   Outcome: Progressing Intervention: REVIEW PLEASE SKILLS (TREAT PHYSICAL ILLNESS, BALANCE EATING, AVOID MOOD-ALTERING SUBSTANCES, BALANCE SLEEP AND GET EXERCISE) WITH [name] Note: Reviewed  Intervention: Discuss self-management skills Note: Discussed problem solving skills (weighing out pros/cons) Intervention: Assist with coping skills and behavior Note: Reviewed

## 2022-03-19 NOTE — Plan of Care (Signed)
  Problem: Depression  Goal:  Decrease depressive symptoms and improve levels of effective functioning-pt reports a decrease in overall depression symptoms 3 out of 5 sessions documented.  Outcome: Progressing Goal: Develop healthy thinking patterns and beliefs about self, others, and the world that lead to the alleviation and help prevent the relapse of depression per self report 3 out of 5 sessions documented.   Outcome: Progressing Intervention: REVIEW PLEASE SKILLS (TREAT PHYSICAL ILLNESS, BALANCE EATING, AVOID MOOD-ALTERING SUBSTANCES, BALANCE SLEEP AND GET EXERCISE) WITH [place] Note: Reviewed  Intervention: Discuss self-management skills Note: Discussed problem solving skills (weighing out pros/cons) Intervention: Assist with coping skills and behavior Note: Reviewed    Problem: Depression  Goal:  Decrease depressive symptoms and improve levels of effective functioning-pt reports a decrease in overall depression symptoms 3 out of 5 sessions documented.  Outcome: Progressing Goal: Develop healthy thinking patterns and beliefs about self, others, and the world that lead to the alleviation and help prevent the relapse of depression per self report 3 out of 5 sessions documented.   Outcome: Progressing Intervention: REVIEW PLEASE SKILLS (TREAT PHYSICAL ILLNESS, BALANCE EATING, AVOID MOOD-ALTERING SUBSTANCES, BALANCE SLEEP AND GET EXERCISE) WITH [name] Note: Reviewed  Intervention: Discuss self-management skills Note: Discussed problem solving skills (weighing out pros/cons) Intervention: Assist with coping skills and behavior Note: Reviewed

## 2022-05-04 ENCOUNTER — Ambulatory Visit (INDEPENDENT_AMBULATORY_CARE_PROVIDER_SITE_OTHER): Payer: No Typology Code available for payment source | Admitting: Licensed Clinical Social Worker

## 2022-05-04 DIAGNOSIS — F331 Major depressive disorder, recurrent, moderate: Secondary | ICD-10-CM

## 2022-05-04 NOTE — Plan of Care (Signed)
  Problem: Depression  Goal:  Decrease depressive symptoms and improve levels of effective functioning-pt reports a decrease in overall depression symptoms 3 out of 5 sessions documented.  Outcome: Progressing Goal: Develop healthy thinking patterns and beliefs about self, others, and the world that lead to the alleviation and help prevent the relapse of depression per self report 3 out of 5 sessions documented.   Outcome: Progressing Intervention: REVIEW PLEASE SKILLS (TREAT PHYSICAL ILLNESS, BALANCE EATING, AVOID MOOD-ALTERING SUBSTANCES, BALANCE SLEEP AND GET EXERCISE) WITH Margaretha Glassing Note: Reviewed  Intervention: Discuss self-management skills Note: Reviewed

## 2022-05-04 NOTE — Progress Notes (Signed)
    39 min  Got rid of the dog--gave to grandson. He had the dog and I was okay with him having the dog. The dog can't take care of himself. Grandson came over without the dog:  "he turned it loose".  The dog is only 61 months old. I have been crazy ever since. Hope someone found him or gave him a home.   Able to travel now. Didn't want dog suffering in the cage.   Sleep: wasn't sleeping well--worried about the dog.put the Audible on--choose a book and let the book play. I would listen to the book until I went to sleep. Slept very well doing tha. Able to sleep--book is still reading while I'm sleeping. I sleep all night. Sometime.  I live in a "[facility] [facility] Organization where they build homes in [place]. The man Wilson Singer) asks me to come and speak at the other [facility]. Tell people what its like.  Reading helps stop intrusive thoughts.  Husband called me on thanksgiving. "How did you get that place"  Everything got gone at thankdsgiving. All food was eaten. Didn't have to put up anything

## 2022-06-03 ENCOUNTER — Encounter (HOSPITAL_COMMUNITY): Payer: Self-pay | Admitting: Psychiatry

## 2022-06-03 ENCOUNTER — Telehealth (HOSPITAL_BASED_OUTPATIENT_CLINIC_OR_DEPARTMENT_OTHER): Payer: No Typology Code available for payment source | Admitting: Psychiatry

## 2022-06-03 DIAGNOSIS — F331 Major depressive disorder, recurrent, moderate: Secondary | ICD-10-CM | POA: Diagnosis not present

## 2022-06-03 DIAGNOSIS — F4312 Post-traumatic stress disorder, chronic: Secondary | ICD-10-CM | POA: Diagnosis not present

## 2022-06-03 MED ORDER — LURASIDONE HCL 40 MG PO TABS: 40.0000 mg | ORAL_TABLET | Freq: Every day | ORAL | 2 refills | Status: DC

## 2022-06-03 MED ORDER — FLUOXETINE HCL 20 MG PO CAPS: 20.0000 mg | ORAL_CAPSULE | Freq: Every day | ORAL | 2 refills | Status: DC
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
## 2022-06-10 ENCOUNTER — Other Ambulatory Visit: Payer: Self-pay | Admitting: Family Medicine

## 2022-06-10 DIAGNOSIS — R200 Anesthesia of skin: Secondary | ICD-10-CM

## 2022-06-10 DIAGNOSIS — M79604 Pain in right leg: Secondary | ICD-10-CM

## 2022-06-15 ENCOUNTER — Ambulatory Visit (HOSPITAL_COMMUNITY): Payer: No Typology Code available for payment source | Admitting: Licensed Clinical Social Worker

## 2022-06-15 DIAGNOSIS — F4312 Post-traumatic stress disorder, chronic: Secondary | ICD-10-CM | POA: Diagnosis not present
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
## 2022-06-18 ENCOUNTER — Ambulatory Visit: Admission: RE | Admit: 2022-06-18 | Payer: Medicaid Other | Source: Ambulatory Visit

## 2022-07-16 ENCOUNTER — Ambulatory Visit (HOSPITAL_COMMUNITY): Payer: No Typology Code available for payment source | Admitting: Licensed Clinical Social Worker

## 2022-07-16 DIAGNOSIS — F331 Major depressive disorder, recurrent, moderate: Secondary | ICD-10-CM

## 2022-07-16 DIAGNOSIS — F4312 Post-traumatic stress disorder, chronic: Secondary | ICD-10-CM

## 2022-07-16 NOTE — Progress Notes (Signed)
Virtual Visit via Audio Note  I connected with Lynn [name] on 07/16/22 at  2:00 PM EST by an audio enabled telemedicine application and verified that I am speaking with the correct person using two identifiers.  Video connection was lost when less than 50% of the duration of the visit was complete, at which time the remainder of the visit was completed via audio only.  Location: Patient: home Provider: remote office [place], [place])   I discussed the limitations of evaluation and management by telemedicine and the availability of in person appointments. The patient expressed understanding and agreed to proceed.  I discussed the assessment and treatment plan with the patient. The patient was provided an opportunity to ask questions and all were answered. The patient agreed with the plan and demonstrated an understanding of the instructions.   The patient was advised to call back or seek an in-person evaluation if the symptoms worsen or if the condition fails to improve as anticipated.  I provided 45 minutes of non-face-to-face time during this encounter.   Alisyn Lequire R Fremon Zacharia, LCSW   THERAPIST PROGRESS NOTE  Session Time: 3-345p  Participation Level: Active  Behavioral Response: NeatAlertAnxious  Type of Therapy: Individual Therapy  Treatment Goals addressed:  Develop healthy thinking patterns and beliefs about self, others, and the world that lead to the alleviation and help prevent the relapse of depression per self report 3 out of 5 sessions documented   ProgressTowards Goals: Progressing  Interventions: CBT, Motivational Interviewing, and Supportive  Summary: Lynn [name] is a 62 y.o. female who presents with improving symptoms related to depression diagnosis. Pt reports that her overall mood is stable and that she is compliant with her medication.   Allowed pt to explore and express thoughts and feelings associated with recent life situations and external stressors.   Patient reports that her relationship with her ex-husband triggers situational depression, anxiety, and stress at times.  Patient reports that he is still putting pressure on her about getting him an application to live in her community, but not making the efforts to do so on his own.  Patient reports that she is intentionally setting boundaries with him, and feels good about this.  Patient states that her husband has asked her on multiple occasions if he could use her address and say that it is his--patient feels that there are underlying reasons why he is doing this, so she continues to refuse and set boundaries with him.  Patient reports that she is continuing to help the homeless family that she has been helping in the past--patient states she has set boundaries including not getting them food, and not allowing them to sleep inside of her house.  Patient states that she lets them sit in her driveway and sleep in their vehicle.  Patient states that she misses her church family in [place], and is looking forward to seeing them soon again.  Clinician actively listened and provided continued supports for patient --continued recommendations are as follows: self care behaviors, positive social engagements, focusing on overall work/home/life balance, and focusing on positive physical and emotional wellness.   Suicidal/Homicidal: No  Therapist Response: Pt is continuing to apply interventions learned in session into daily life situations. Pt is currently on track to meet goals utilizing interventions mentioned above. Personal growth and progress noted. Treatment to continue as indicated.   Plan: Return again in 4 weeks.  Diagnosis:  Encounter Diagnoses  Name Primary?   Chronic post-traumatic stress disorder (PTSD) Yes   Moderate  episode of recurrent major depressive disorder ([place])    Collaboration of Care: Other Pt to continue care with psychiatrist of record, Dr. Berniece Andreas  Patient/Guardian  was advised Release of Information must be obtained prior to any record release in order to collaborate their care with an outside provider. Patient/Guardian was advised if they have not already done so to contact the registration department to sign all necessary forms in order for [place] to release information regarding their care.   Consent: Patient/Guardian gives verbal consent for treatment and assignment of benefits for services provided during this visit. Patient/Guardian expressed understanding and agreed to proceed.   [place], LCSW 07/16/2022

## 2022-07-23 ENCOUNTER — Telehealth (HOSPITAL_COMMUNITY): Payer: Self-pay | Admitting: *Deleted

## 2022-07-23 NOTE — Telephone Encounter (Signed)
We will wait for the documentation they need.  It is unclear regarding safety document.

## 2022-07-23 NOTE — Telephone Encounter (Signed)
Writer received a VM for Lynn [name] regarding a "safety documentation" needed for Latuda. They advised that provider call [phone]. Writer called pharmacy back having to LVM asking what kind of documentation is needed and could they fax [place] a form maybe. Please review.

## 2022-08-25 ENCOUNTER — Ambulatory Visit (INDEPENDENT_AMBULATORY_CARE_PROVIDER_SITE_OTHER): Payer: No Typology Code available for payment source | Admitting: Licensed Clinical Social Worker

## 2022-08-25 DIAGNOSIS — F4312 Post-traumatic stress disorder, chronic: Secondary | ICD-10-CM

## 2022-08-25 NOTE — Progress Notes (Unsigned)
Virtual Visit via Audio Note  I connected with Halina Lawhorne on 08/25/22 at  3:00 PM EDT by an audio enabled telemedicine application and verified that I am speaking with the correct person using two identifiers.  Video connection was lost when less than 50% of the duration of the visit was complete, at which time the remainder of the visit was completed via audio only.  Location: Patient: home Provider: remote office [place], [place])   I discussed the limitations of evaluation and management by telemedicine and the availability of in person appointments. The patient expressed understanding and agreed to proceed.  I discussed the assessment and treatment plan with the patient. The patient was provided an opportunity to ask questions and all were answered. The patient agreed with the plan and demonstrated an understanding of the instructions.   The patient was advised to call back or seek an in-person evaluation if the symptoms worsen or if the condition fails to improve as anticipated.  I provided 45 minutes of non-face-to-face time during this encounter.   Chrislyn Seedorf R Tamaira Ciriello, LCSW   THERAPIST PROGRESS NOTE  Session Time: 3-345p  Participation Level: Active  Behavioral Response: NeatAlertAnxious  Type of Therapy: Individual Therapy  Treatment Goals addressed:  Develop healthy thinking patterns and beliefs about self, others, and the world that lead to the alleviation and help prevent the relapse of depression per self report 3 out of 5 sessions documented   ProgressTowards Goals: Progressing  Interventions: CBT, Motivational Interviewing, and Supportive  Summary: Ross Szabo is a 62 y.o. female who presents with improving symptoms related to depression diagnosis. Pt reports that her overall mood is stable and that she is compliant with her medication.   Allowed pt to explore and express thoughts and feelings associated with recent life situations and external stressors.   Patient reports that she is continuing to lose weight, her A1c has stabilized, and her kidney function is good.  Patient reports that she is experiencing a significant amount of anxiety associated with an upcoming surgery.  Patient states that she needs a full shoulder replacement, and that she has a doctor's appointment on April 1 to meet the surgeon.  Patient is worried about recovery, rehabilitation, physical therapy, and at home support during her recovery.  Allowed patient safe space to explore her thoughts and feelings, and encouraged patient to speak with the surgeon and surgeons staff at her appointment to help her understand options  Patient states that her daughter is a Designer, jewellery and she does feel that her daughter will assist her the day of surgery.  Encouraged patient to keep her daughter in the loop, and to communicate openly about timing and her needs.    Validated patient's feelings and provided unconditional support and acceptance.  Reviewed overall emotion regulation strategies with patient.  Clinician actively listened and provided continued supports for patient --continued recommendations are as follows: self care behaviors, positive social engagements, focusing on overall work/home/life balance, and focusing on positive physical and emotional wellness.   Suicidal/Homicidal: No  Therapist Response: Pt is continuing to apply interventions learned in session into daily life situations. Pt is currently on track to meet goals utilizing interventions mentioned above. Personal growth and progress noted. Treatment to continue as indicated.   Patient is demonstrating better control of anxiety and stress.  Patient is able to identify anxiety triggers and can articulate coping strategies that are successful in managing symptoms.  Plan: Return again in 4 weeks.  Diagnosis:  Encounter Diagnosis  Name Primary?   Chronic post-traumatic stress disorder (PTSD) Yes    Collaboration  of Care: Other Pt to continue care with psychiatrist of record, Dr. Berniece Andreas  Patient/Guardian was advised Release of Information must be obtained prior to any record release in order to collaborate their care with an outside provider. Patient/Guardian was advised if they have not already done so to contact the registration department to sign all necessary forms in order for [place] to release information regarding their care.   Consent: Patient/Guardian gives verbal consent for treatment and assignment of benefits for services provided during this visit. Patient/Guardian expressed understanding and agreed to proceed.   [place], LCSW 08/25/2022

## 2022-08-31 ENCOUNTER — Other Ambulatory Visit: Payer: Self-pay | Admitting: Orthopaedic Surgery

## 2022-08-31 DIAGNOSIS — M19011 Primary osteoarthritis, right shoulder: Secondary | ICD-10-CM

## 2022-09-02 ENCOUNTER — Encounter (HOSPITAL_COMMUNITY): Payer: Self-pay | Admitting: Psychiatry

## 2022-09-02 ENCOUNTER — Telehealth (HOSPITAL_BASED_OUTPATIENT_CLINIC_OR_DEPARTMENT_OTHER): Payer: No Typology Code available for payment source | Admitting: Psychiatry

## 2022-09-02 DIAGNOSIS — F331 Major depressive disorder, recurrent, moderate: Secondary | ICD-10-CM

## 2022-09-02 DIAGNOSIS — F4312 Post-traumatic stress disorder, chronic: Secondary | ICD-10-CM

## 2022-09-02 MED ORDER — LURASIDONE HCL 40 MG PO TABS: 40.0000 mg | ORAL_TABLET | Freq: Every day | ORAL | 2 refills | Status: DC

## 2022-09-02 MED ORDER — FLUOXETINE HCL 20 MG PO CAPS: 20.0000 mg | ORAL_CAPSULE | Freq: Every day | ORAL | 2 refills | Status: DC
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
## 2022-09-10 ENCOUNTER — Ambulatory Visit
Admission: RE | Admit: 2022-09-10 | Discharge: 2022-09-10 | Disposition: A | Discharge status: Home / Self Care | Payer: Medicaid Other | Source: Ambulatory Visit | Attending: Orthopaedic Surgery | Admitting: Orthopaedic Surgery

## 2022-09-10 DIAGNOSIS — M19011 Primary osteoarthritis, right shoulder: Secondary | ICD-10-CM | POA: Diagnosis present

## 2022-09-16 ENCOUNTER — Ambulatory Visit (INDEPENDENT_AMBULATORY_CARE_PROVIDER_SITE_OTHER): Payer: No Typology Code available for payment source | Admitting: Licensed Clinical Social Worker

## 2022-09-16 DIAGNOSIS — F4312 Post-traumatic stress disorder, chronic: Secondary | ICD-10-CM

## 2022-09-16 NOTE — Progress Notes (Signed)
Virtual Visit via Audio Note  I connected with Lynn [name] on 09/16/22 at  3:00 PM EDT by an audio enabled telemedicine application and verified that I am speaking with the correct person using two identifiers.  Video connection was lost when less than 50% of the duration of the visit was complete, at which time the remainder of the visit was completed via audio only.  Location: Patient: home Provider: remote office [place], [place])   I discussed the limitations of evaluation and management by telemedicine and the availability of in person appointments. The patient expressed understanding and agreed to proceed.  I discussed the assessment and treatment plan with the patient. The patient was provided an opportunity to ask questions and all were answered. The patient agreed with the plan and demonstrated an understanding of the instructions.   The patient was advised to call back or seek an in-person evaluation if the symptoms worsen or if the condition fails to improve as anticipated.  I provided 45 minutes of non-face-to-face time during this encounter.   Xavior Niazi R  Shellhammer, LCSW   THERAPIST PROGRESS NOTE  Session Time: 3-345p  Participation Level: Active  Behavioral Response: NeatAlertAnxious  Type of Therapy: Individual Therapy  Treatment Goals addressed:  Develop healthy thinking patterns and beliefs about self, others, and the world that lead to the alleviation and help prevent the relapse of depression per self report 3 out of 5 sessions documented   ProgressTowards Goals: Progressing  Interventions: CBT, Motivational Interviewing, and Supportive  Summary: Lynn [name] is a 62 y.o. female who presents with improving symptoms related to depression diagnosis. Pt reports that her overall mood is stable and that she is compliant with her medication.   Surgery: going to be in [place] at specialty hospital and CT scan. Cat scan will tell dr the type of surgery he's going  to do. Go back on the 25th. He will tell me what he needs to do.   Dtr is going to take off work the day before, the day of, and the day after. Staying with her for 3 weeks after the surgery. Worried about daughter.   Went to kidney--doing excellent..  A1C is going well. Losing weight.    Knee is doing good. Before I go into surgery, haven't seen anyone for my knee.  Its doing good.  Go get a shot in my knee before I get hte shoulder done. Can't drive--hard time doing hair or any activities.   Son called: big issue/upsetting me. He has been dating this girl--went and got an apartment. The girls mothre came to the apartment--got him put out of the apartment. He called me crying. I don't know what to do about 6that--don't want him here. Lots of baggage and trouble.    Clinician actively listened and provided continued supports for patient --continued recommendations are as follows: self care behaviors, positive social engagements, focusing on overall work/home/life balance, and focusing on positive physical and emotional wellness.   Suicidal/Homicidal: No  Therapist Response: Pt is continuing to apply interventions learned in session into daily life situations. Pt is currently on track to meet goals utilizing interventions mentioned above. Personal growth and progress noted. Treatment to continue as indicated.   Patient is demonstrating better control of anxiety and stress.  Patient is able to identify anxiety triggers and can articulate coping strategies that are successful in managing symptoms.  Plan: Return again in 4 weeks.  Diagnosis:  No diagnosis found.   Collaboration of Care: Other Pt to  continue care with psychiatrist of record, Dr. Kathryne Sharper  Patient/Guardian was advised Release of Information must be obtained prior to any record release in order to collaborate their care with an outside provider. Patient/Guardian was advised if they have not already done so to contact the  registration department to sign all necessary forms in order for [place] to release information regarding their care.   Consent: Patient/Guardian gives verbal consent for treatment and assignment of benefits for services provided during this visit. Patient/Guardian expressed understanding and agreed to proceed.  Portions of this report may have been transcribed using voice recognition software. Every effort was made to ensure accuracy; however, inadvertent computerized transcription errors may be present    [name], LCSW 09/16/2022

## 2022-10-27 ENCOUNTER — Ambulatory Visit (HOSPITAL_COMMUNITY): Payer: No Typology Code available for payment source | Admitting: Licensed Clinical Social Worker

## 2022-10-27 DIAGNOSIS — F4312 Post-traumatic stress disorder, chronic: Secondary | ICD-10-CM | POA: Diagnosis not present
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
## 2022-11-19 ENCOUNTER — Ambulatory Visit (INDEPENDENT_AMBULATORY_CARE_PROVIDER_SITE_OTHER): Payer: No Typology Code available for payment source | Admitting: Licensed Clinical Social Worker

## 2022-11-19 DIAGNOSIS — F4312 Post-traumatic stress disorder, chronic: Secondary | ICD-10-CM

## 2022-11-19 NOTE — Progress Notes (Signed)
Virtual Visit via Video Note  I connected with Lynn [name] on 11/19/22 at  2:00 PM EDT by a video enabled telemedicine application and verified that I am speaking with the correct person using two identifiers.  Location: Patient: home Provider: remote office [place], [place])   I discussed the limitations of evaluation and management by telemedicine and the availability of in person appointments. The patient expressed understanding and agreed to proceed.  I discussed the assessment and treatment plan with the patient. The patient was provided an opportunity to ask questions and all were answered. The patient agreed with the plan and demonstrated an understanding of the instructions.   The patient was advised to call back or seek an in-person evaluation if the symptoms worsen or if the condition fails to improve as anticipated.  I provided 30 minutes of non-face-to-face time during this encounter.   Lynn [name] R Saumya Hukill, LCSW   THERAPIST PROGRESS NOTE  Session Time: 2-230p  Participation Level: Active  Behavioral Response: NeatAlertAnxious  Type of Therapy: Individual Therapy  Treatment Goals addressed:  Develop healthy thinking patterns and beliefs about self, others, and the world that lead to the alleviation and help prevent the relapse of depression per self report 3 out of 5 sessions documented   ProgressTowards Goals: Progressing  Interventions: CBT, Motivational Interviewing, and Supportive  Summary: Lynn [name] is a 61 y.o. female who presents with improving symptoms related to depression diagnosis.   Assisted pt with identifying anxiety triggers. Discussed importance of pushing through the avoidance response and to use coping skills to manage any feelings of minor distress. Allowed pt to explore and identify any limits or boundaries that could be helpful with managing stress/anxiety triggers. Pt feels anxious about upcoming surgery. Allowed pt to explore ways to help  calm nerves as she is planning upcoming surgery.  Clinician assisted pt with identifying current levels of chronic pain (knee), and explored previously recommended (by medical community) interventions for treating chronic pain. Discussed overall impact of pain on daily activities, relationships, and ability/inability to engage in self care or recreational activities. Reviewed keeping balance between rest and activity, and to continue communicating with medical providers for pain management. Reviewed mindfulness and meditation as relaxation interventions.   Explored patients overall focus on wellness. Discussed emotional wellness and included discussions about improving overall work/life balance and improvements in self care. Discussed physical wellness and included discussions about improving physical activity (at pts ability level) and reviewed current nutritional choices and reviewed healthy nutritional choices. Pt states that she is continuing to lose weight. Allowed pt to explore what changes and improvements that they feel would be good personal goals without feeling too overwhelmed. Pt committed to change.  Continued recommendations are as follows: self care behaviors, positive social engagements, focusing on overall work/home/life balance, and focusing on positive physical and emotional wellness.   Suicidal/Homicidal: No  Therapist Response: Pt is continuing to apply interventions learned in session into daily life situations. Pt is currently on track to meet goals utilizing interventions mentioned above. Personal growth and progress noted. Treatment to continue as indicated.   Patient is demonstrating better control of anxiety and stress.  Patient is able to identify anxiety triggers and can articulate coping strategies that are successful in managing symptoms.  Plan: Return again in 4 weeks.  Diagnosis:  Encounter Diagnosis  Name Primary?   Chronic post-traumatic stress disorder (PTSD)  Yes    Collaboration of Care: Other Pt to continue care with psychiatrist of record, Dr. Kathryne Sharper  Patient/Guardian was advised Release of Information must be obtained prior to any record release in order to collaborate their care with an outside provider. Patient/Guardian was advised if they have not already done so to contact the registration department to sign all necessary forms in order for [place] to release information regarding their care.   Consent: Patient/Guardian gives verbal consent for treatment and assignment of benefits for services provided during this visit. Patient/Guardian expressed understanding and agreed to proceed.  Portions of this report may have been transcribed using voice recognition software. Every effort was made to ensure accuracy; however, inadvertent computerized transcription errors may be present    [name], LCSW 11/19/2022

## 2022-12-02 ENCOUNTER — Telehealth (HOSPITAL_COMMUNITY): Payer: MEDICAID | Admitting: Psychiatry

## 2022-12-02 ENCOUNTER — Encounter (HOSPITAL_COMMUNITY): Payer: Self-pay | Admitting: Psychiatry

## 2022-12-02 VITALS — Wt 212.0 lb

## 2022-12-02 DIAGNOSIS — F331 Major depressive disorder, recurrent, moderate: Secondary | ICD-10-CM | POA: Diagnosis not present

## 2022-12-02 DIAGNOSIS — F4312 Post-traumatic stress disorder, chronic: Secondary | ICD-10-CM | POA: Diagnosis not present

## 2022-12-02 MED ORDER — LURASIDONE HCL 40 MG PO TABS: 40.0000 mg | ORAL_TABLET | Freq: Every day | ORAL | 2 refills | Status: DC

## 2022-12-02 MED ORDER — FLUOXETINE HCL 20 MG PO CAPS: 20.0000 mg | ORAL_CAPSULE | Freq: Every day | ORAL | 2 refills | Status: DC
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
## 2022-12-08 ENCOUNTER — Ambulatory Visit (INDEPENDENT_AMBULATORY_CARE_PROVIDER_SITE_OTHER): Payer: MEDICAID | Admitting: Licensed Clinical Social Worker

## 2022-12-08 ENCOUNTER — Encounter (HOSPITAL_COMMUNITY): Payer: Self-pay

## 2022-12-08 DIAGNOSIS — Z91199 Patient's noncompliance with other medical treatment and regimen due to unspecified reason: Secondary | ICD-10-CM

## 2022-12-08 NOTE — Progress Notes (Signed)
LCSW counselor attempted to connect with patient for scheduled appointment via MyChart video text request x 2 and email request with no response.   Attempt 1: Text and email: 2:05p  Attempt 2: Text and email: 2:12p  Video session was closed at :  2:16pm  Per [facility] policy, after multiple attempts to reach pt unsuccessfully at appointed time--visit will be coded as no show

## 2022-12-11 ENCOUNTER — Other Ambulatory Visit (HOSPITAL_COMMUNITY): Payer: Self-pay

## 2022-12-11 ENCOUNTER — Encounter (HOSPITAL_COMMUNITY): Payer: Self-pay

## 2023-03-03 ENCOUNTER — Other Ambulatory Visit (HOSPITAL_COMMUNITY): Payer: Self-pay | Admitting: Psychiatry

## 2023-03-03 ENCOUNTER — Encounter (HOSPITAL_COMMUNITY): Payer: Self-pay | Admitting: Psychiatry

## 2023-03-03 ENCOUNTER — Telehealth (HOSPITAL_BASED_OUTPATIENT_CLINIC_OR_DEPARTMENT_OTHER): Payer: MEDICAID | Admitting: Psychiatry

## 2023-03-03 VITALS — Wt 230.0 lb

## 2023-03-03 DIAGNOSIS — F4312 Post-traumatic stress disorder, chronic: Secondary | ICD-10-CM

## 2023-03-03 DIAGNOSIS — F331 Major depressive disorder, recurrent, moderate: Secondary | ICD-10-CM

## 2023-03-03 MED ORDER — FLUOXETINE HCL 20 MG PO CAPS
20.0000 mg | ORAL_CAPSULE | Freq: Every day | ORAL | 2 refills | Status: DC
Start: 2023-03-03 — End: 2023-05-18

## 2023-03-03 MED ORDER — LURASIDONE HCL 40 MG PO TABS
40.0000 mg | ORAL_TABLET | Freq: Every day | ORAL | 2 refills | Status: DC
Start: 2023-03-03 — End: 2023-05-18
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
## 2023-03-24 ENCOUNTER — Ambulatory Visit (INDEPENDENT_AMBULATORY_CARE_PROVIDER_SITE_OTHER): Payer: MEDICAID | Admitting: Licensed Clinical Social Worker

## 2023-03-24 ENCOUNTER — Encounter (HOSPITAL_COMMUNITY): Payer: Self-pay

## 2023-03-24 DIAGNOSIS — F331 Major depressive disorder, recurrent, moderate: Secondary | ICD-10-CM | POA: Diagnosis not present
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
## 2023-03-30 ENCOUNTER — Other Ambulatory Visit: Payer: Self-pay | Admitting: Family Medicine

## 2023-03-30 DIAGNOSIS — Z1231 Encounter for screening mammogram for malignant neoplasm of breast: Secondary | ICD-10-CM

## 2023-04-01 ENCOUNTER — Ambulatory Visit (HOSPITAL_COMMUNITY): Payer: MEDICAID | Admitting: Licensed Clinical Social Worker

## 2023-04-01 DIAGNOSIS — F331 Major depressive disorder, recurrent, moderate: Secondary | ICD-10-CM

## 2023-04-01 NOTE — Progress Notes (Signed)
THERAPIST PROGRESS NOTE   Session Date: 04/01/2023 Session Time: 1104 - 1151 Virtual Visit via Video Note  I connected with [name] on 04/01/23 at 11:00 AM EDT by a video enabled telemedicine application and verified that I am speaking with the correct person using two identifiers.  Location: Patient: Home Provider: OPT BH Office   I discussed the limitations of evaluation and management by telemedicine and the availability of in person appointments. The patient expressed understanding and agreed to proceed.   I discussed the assessment and treatment plan with the patient. The patient was provided an opportunity to ask questions and all were answered. The patient agreed with the plan and demonstrated an understanding of the instructions.   The patient was advised to call back or seek an in-person evaluation if the symptoms worsen or if the condition fails to improve as anticipated.  I provided 46 minutes of non-face-to-face time during this encounter.  Participation Level: Active  Behavioral Response: Fairly GroomedAlertEuthymic  Type of Therapy: Individual Therapy  Treatment Goals addressed:  - [place] will reduce frequency of avoidant behaviors by 50% as evidenced by self report in therapy sessions - Jaella will identify cognitive patterns and beliefs that support depression  - Reduce overall depression score by a minimum of 25% on the Patient Health Questionnaire (PHQ-9)  - Increase coping skills to manage depression and improve ability to perform daily activities - Reduce frequency, intensity, and duration of depression symptoms so that daily functioning is improved  - Report a decrease in anxiety symptoms as evidenced by an overall reduction in anxiety score by a minimum of 25% on the Generalized Anxiety Disorder Scale (GAD-7)  - [place] will score less than 5 on the Generalized Anxiety Disorder 7 Scale (GAD-7)   ProgressTowards Goals: Progressing  Interventions:  CBT, Motivational Interviewing, Solution Focused, and Supportive  Summary: Doninique is a 62 year old female who presents with increased anxiety and stress over the recent week surrounding her adult son having to move out of her home and uncertainty how to approach situation.  Patient reports 2 stressors over the past week primarily being that of her son residing in her home and rental office showing concern surrounding him not being on the lease and additional stress reported being specific to adult daughter notifying her of her pregnancy and patient feeling as though daughter will force her to provide care for her infant.  Patient actively detailed concerns surrounding her abilities to care for her newborn at her current age as well as significant stress specific to how to approach discussion with the daughter regarding her decision to not care for new baby. Patient engaged in session. Patient responded well to interventions and proved receptive to feedback from clinician. Patient continues to meet criteria for Moderate episode of recurrent major depressive disorder (HCC) . Patient will continue to benefit from engagement in outpatient therapy due to being the least restrictive service to meet presenting needs.   Patient made minimal progress on identified tx goals at this time.   Suicidal/Homicidal: No  Therapist Response: Clinician utilized CBT, motivational interviewing, solution focused, and supportive reflection techniques in aiding the patient to navigate thoughts and feelings and emotions surrounding presenting stressors.  Clinician explored patient's understanding of boundaries and her abilities to clearly express her own thoughts and feelings surrounding her boundaries as well as further encouraged patient to begin actively journaling thoughts and feelings in order to support her navigation of presenting stressors. Therapist provided support and empathy to patient during session.  Plan: Return  again in 1 weeks.  Diagnosis: Moderate episode of recurrent major depressive disorder (HCC)   Collaboration of Care: Other None deemed necessary at this time.  Patient/Guardian was advised Release of Information must be obtained prior to any record release in order to collaborate their care with an outside provider. Patient/Guardian was advised if they have not already done so to contact the registration department to sign all necessary forms in order for [place] to release information regarding their care.   Consent: Patient/Guardian gives verbal consent for treatment and assignment of benefits for services provided during this visit. Patient/Guardian expressed understanding and agreed to proceed.   Leisa Lenz, LCSW 04/01/2023,  11:51 AM

## 2023-04-08 ENCOUNTER — Ambulatory Visit (HOSPITAL_COMMUNITY): Payer: MEDICAID | Admitting: Licensed Clinical Social Worker

## 2023-04-08 DIAGNOSIS — F331 Major depressive disorder, recurrent, moderate: Secondary | ICD-10-CM
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
## 2023-04-16 ENCOUNTER — Ambulatory Visit (INDEPENDENT_AMBULATORY_CARE_PROVIDER_SITE_OTHER): Payer: MEDICAID | Admitting: Licensed Clinical Social Worker

## 2023-04-16 DIAGNOSIS — F331 Major depressive disorder, recurrent, moderate: Secondary | ICD-10-CM

## 2023-04-16 NOTE — Progress Notes (Signed)
THERAPIST PROGRESS NOTE  Session Date: 04/16/2023 Session Time: 0912 - 0957 Virtual Visit via Video Note  I connected with Lynn [name] on 04/16/23 at  9:00 AM EST by a video enabled telemedicine application and verified that I am speaking with the correct person using two identifiers.  Location: Patient: Home Provider: [facility]   I discussed the limitations of evaluation and management by telemedicine and the availability of in person appointments. The patient expressed understanding and agreed to proceed.   I discussed the assessment and treatment plan with the patient. The patient was provided an opportunity to ask questions and all were answered. The patient agreed with the plan and demonstrated an understanding of the instructions.   The patient was advised to call back or seek an in-person evaluation if the symptoms worsen or if the condition fails to improve as anticipated.  I provided 45 minutes of non-face-to-face time during this encounter. Participation Level: Active  Behavioral Response: Casual and Fairly GroomedAlertDepressed, Euthymic, and Irritable  Type of Therapy: Individual Therapy  Treatment Goals addressed: - [name] will reduce frequency of avoidant behaviors by 50% as evidenced by self report in therapy sessions - [name] will identify cognitive patterns and beliefs that support depression  - Reduce overall depression score by a minimum of 25% on the Patient Health Questionnaire (PHQ-9)  - Increase coping skills to manage depression and improve ability to perform daily activities - Reduce frequency, intensity, and duration of depression symptoms so that daily functioning is improved  - Report a decrease in anxiety symptoms as evidenced by an overall reduction in anxiety score by a minimum of 25% on the Generalized Anxiety Disorder Scale (GAD-7)  - [name] will score less than 5 on the Generalized Anxiety Disorder 7 Scale (GAD-7)   ProgressTowards Goals:  Not Progressing  Interventions: CBT, Motivational Interviewing, and Supportive  Summary: Lynn [name] is a 62 year old African-American female who presents for follow-up therapy appointment in the continued management of depressive symptoms.  Patient actively engaged in session, reporting of being outside of the home today in order to attend PT apt this morning. Further detailed having gotten over stressors of last week specific to the election results, and presenting challenges this week being that of her son currently residing in her home and her readiness for him to move out with her no longer requiring support for past surgery. Processed pt's isolative behaviors, noting of her spending close to 24 hours a day in her room, having no desire to cook a meal, having not thoroughly cleaned her home in a while, and simply enjoying spending her days on her tablet and phone. Processed pt's understanding of isolative behaviors, depressive cycles, and pt's thoughts, feelings, and behaviors supporting depression. Pt reported "I don't think I'm depressed, I'm not suicidal or anything", further processing pt's understanding in variances in sxs and severity. Patient engaged in session. Patient responded well to interventions and proved receptive to feedback from clinician. Patient continues to meet criteria for Moderate episode of recurrent major depressive disorder (HCC) . Patient will continue to benefit from engagement in outpatient therapy due to being the least restrictive service to meet presenting needs.    Patient made minimal progress on identified tx goals at this time.   Suicidal/Homicidal: No  Therapist Response: Clinician utilized CBT, MI, supportive reflection techniques to support pt in navigating thoughts and feelings surrounding presenting stressors and sxs. Clinician processed pt's daily activities/events and explored the depressive cycle pt's activities supports. Further explored pt's reluctance  to engage with others and increase social interactions. Encouraged pt to visit local [employer] to explore availability of programs that would aid in increasing socialization and healthy living. Therapist provided support and empathy to patient during session.  Plan: Return again in 1 weeks.  Diagnosis:  Encounter Diagnosis  Name Primary?   Moderate episode of recurrent major depressive disorder (HCC) Yes     Collaboration of Care: Other none necessary at this time.  Patient/Guardian was advised Release of Information must be obtained prior to any record release in order to collaborate their care with an outside provider. Patient/Guardian was advised if they have not already done so to contact the registration department to sign all necessary forms in order for [place] to release information regarding their care.   Consent: Patient/Guardian gives verbal consent for treatment and assignment of benefits for services provided during this visit. Patient/Guardian expressed understanding and agreed to proceed.   Leisa Lenz, MSW, LCSW 04/16/2023,  9:09 AM

## 2023-04-23 ENCOUNTER — Ambulatory Visit (INDEPENDENT_AMBULATORY_CARE_PROVIDER_SITE_OTHER): Payer: MEDICAID | Admitting: Licensed Clinical Social Worker

## 2023-04-23 DIAGNOSIS — F331 Major depressive disorder, recurrent, moderate: Secondary | ICD-10-CM

## 2023-04-23 NOTE — Progress Notes (Signed)
THERAPIST PROGRESS NOTE  Session Date: 04/23/2023 Session Time: 0848 - 0935 Virtual Visit via Video Note  I connected with Lynn [name] on 04/23/23 at  08:48 AM EST by a video enabled telemedicine application and verified that I am speaking with the correct person using two identifiers.  Location: Patient: Community Provider: [facility]   I discussed the limitations of evaluation and management by telemedicine and the availability of in person appointments. The patient expressed understanding and agreed to proceed.   I discussed the assessment and treatment plan with the patient. The patient was provided an opportunity to ask questions and all were answered. The patient agreed with the plan and demonstrated an understanding of the instructions.   The patient was advised to call back or seek an in-person evaluation if the symptoms worsen or if the condition fails to improve as anticipated.  I provided 46 minutes of non-face-to-face time during this encounter. Participation Level: Active  Behavioral Response: Casual and Fairly GroomedAlertAnxious  Type of Therapy: Individual Therapy  Treatment Goals addressed: - Lynn [name] will reduce frequency of avoidant behaviors by 50% as evidenced by self report in therapy sessions - Lynn [name] will identify cognitive patterns and beliefs that support depression  - Reduce overall depression score by a minimum of 25% on the Patient Health Questionnaire (PHQ-9)  - Increase coping skills to manage depression and improve ability to perform daily activities - Reduce frequency, intensity, and duration of depression symptoms so that daily functioning is improved  - Report a decrease in anxiety symptoms as evidenced by an overall reduction in anxiety score by a minimum of 25% on the Generalized Anxiety Disorder Scale (GAD-7)  - Lynn [name] will score less than 5 on the Generalized Anxiety Disorder 7 Scale (GAD-7)   ProgressTowards Goals:  Progressing  Interventions: CBT, Motivational Interviewing, and Supportive  Summary: Lynn [name] is a 62 year old African-American female who presents for follow-up therapy appointment in the continued management of depressive symptoms.  Patient actively engaged in session, reporting of being at a location within community, waiting to attend PT apt this morning. Pt actively engaged in re-administering of PHQ9 and GAD7, further reflecting on past weekly events and identifying areas of increased anxiety and stress. 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 Pt explored factors she feels she should attend meeting in comparison to factors as to why it would not be supportive or beneficial for herself. Revisited pt's prior inabilities/barriers to visit local [employer] to explore available programs for pt to increase socialization, and pt's commitment to doing so within the coming days. Patient responded well to interventions and proved receptive to feedback from clinician. Patient continues to meet criteria for Moderate episode of recurrent major depressive disorder (HCC) . Patient will continue to benefit from engagement in outpatient therapy due to being the least restrictive service to meet presenting needs.     04/23/2023    8:56 AM 04/08/2023    4:49 PM 03/24/2023    3:20 PM 11/23/2022    7:58 AM 08/25/2022    3:55 PM  Depression screen PHQ 2/9  Decreased Interest 0 3 1 1 1   Down, Depressed, Hopeless 1 0 1 1 1   PHQ - 2 Score 1 3 2 2 2   Altered sleeping 1 1 2  3   Tired, decreased energy 1 1 1  1   Change  in appetite 2 3 1  3   Feeling bad or failure about yourself  3 0 2  1  Trouble concentrating 3 3 3  1   Moving slowly or fidgety/restless 0 0 0  1  Suicidal thoughts 0 0 0  0  PHQ-9 Score 11 11 11  12    Difficult doing work/chores Not difficult at all Somewhat difficult Somewhat difficult        04/23/2023    9:02 AM 04/08/2023    4:45 PM 03/24/2023    3:19 PM  GAD 7 : Generalized Anxiety Score  Nervous, Anxious, on Edge 1 1 1   Control/stop worrying 1 1 1   Worry too much - different things 1 1 1   Trouble relaxing 1 0 1  Restless 0 0 0  Easily annoyed or irritable 1 0 1  Afraid - awful might happen 1 1 1   Total GAD 7 Score 6 4 6   Anxiety Difficulty Not difficult at all Very difficult Somewhat difficult   Patient made minimal progress on identified tx goals at this time.   Suicidal/Homicidal: No  Therapist Response: Clinician utilized CBT, MI, supportive reflection techniques to support pt in navigating thoughts and feelings surrounding presenting stressors and sxs. Supported pt in exploring primary presenting stressor of which pt has reported experiencing increased anxious sxs related to over the past 3-4 days, further evoking pt's thoughts surrounding factors and processing pt's feelings specific to the matter. Revisited pt's efforts to attend local [employer] in order to increase social interaction and activity, exploring barriers and overall commitment to attending over the coming days. Therapist provided support and empathy to patient during session.  Plan: Return again in 2 weeks.  Diagnosis:  Encounter Diagnosis  Name Primary?   Moderate episode of recurrent major depressive disorder (HCC) Yes      Collaboration of Care: Other none necessary at this time.  Patient/Guardian was advised Release of Information must be obtained prior to any record release in order to collaborate their care with an outside provider. Patient/Guardian was advised if they have not already done so to contact the registration department to sign all necessary forms in order for [place] to release information regarding their care.   Consent: Patient/Guardian gives verbal consent for treatment and assignment of  benefits for services provided during this visit. Patient/Guardian expressed understanding and agreed to proceed.   Leisa Lenz, MSW, LCSW 04/23/2023,  9:05 AM

## 2023-05-07 ENCOUNTER — Ambulatory Visit (INDEPENDENT_AMBULATORY_CARE_PROVIDER_SITE_OTHER): Payer: MEDICAID | Admitting: Licensed Clinical Social Worker

## 2023-05-07 DIAGNOSIS — F331 Major depressive disorder, recurrent, moderate: Secondary | ICD-10-CM | POA: Diagnosis not present

## 2023-05-07 NOTE — Progress Notes (Unsigned)
THERAPIST PROGRESS NOTE  Session Date: 05/07/2023 Session Time: 0910 - 1002 Virtual Visit via Video Note  I connected with Lynn [name] on 04/23/23 at  09:10 AM EST by a video enabled telemedicine application and verified that I am speaking with the correct person using two identifiers.  Location: Patient: Home Provider: [facility]   I discussed the limitations of evaluation and management by telemedicine and the availability of in person appointments. The patient expressed understanding and agreed to proceed.   The patient was advised to call back or seek an in-person evaluation if the symptoms worsen or if the condition fails to improve as anticipated.  I provided 52 minutes of non-face-to-face time during this encounter.  Participation Level: Active  Behavioral Response: Casual and Fairly GroomedAlertAnxious, Depressed, and flat  Type of Therapy: Individual Therapy  Treatment Goals addressed: - Lynn [name] will reduce frequency of avoidant behaviors by 50% as evidenced by self report in therapy sessions - Lynn [name] will identify cognitive patterns and beliefs that support depression  - Reduce overall depression score by a minimum of 25% on the Patient Health Questionnaire (PHQ-9)  - Increase coping skills to manage depression and improve ability to perform daily activities - Reduce frequency, intensity, and duration of depression symptoms so that daily functioning is improved  - Report a decrease in anxiety symptoms as evidenced by an overall reduction in anxiety score by a minimum of 25% on the Generalized Anxiety Disorder Scale (GAD-7)  - Lynn [name] will score less than 5 on the Generalized Anxiety Disorder 7 Scale (GAD-7)   ProgressTowards Goals: Not Progressing  Interventions: CBT, Motivational Interviewing, and Supportive  Summary: Lynn [name] is a 62 year old African-American female who presents for follow-up therapy appointment in the continued management of depressive  symptoms. Pt presented in depressed moods and flat affect. Pt engaged in re-administering of PHQ9 and GAD7, processing increased depressive and anxious sxs severity. Pt detailed recent events of an attempted break in at her home causing her to become increasingly anxious, as well as feeling as though she had let her grandson down since not recognizing him at holiday dinner, and further worried due to daughter's comments of potential dementia. Processed feelings and pt continued thoughts processes that support pt's depressive states. Revisited continued barriers to visit local [employer] to explore available programs for pt to increase socialization, and pt's commitment to doing so within the coming days. Patient responded well to interventions and proved receptive to feedback from clinician. Patient continues to meet criteria for Moderate episode of recurrent major depressive disorder (HCC) . Patient will continue to benefit from engagement in outpatient therapy due to being the least restrictive service to meet presenting needs.       05/07/2023    9:29 AM 04/23/2023    8:56 AM 04/08/2023    4:49 PM 03/24/2023    3:20 PM 11/23/2022    7:58 AM  Depression screen PHQ 2/9  Decreased Interest 2 0 3 1 1   Down, Depressed, Hopeless 1 1 0 1 1  PHQ - 2 Score 3 1 3 2 2   Altered sleeping 2 1 1 2    Tired, decreased energy 2 1 1 1    Change in appetite 1 2 3 1    Feeling bad or failure about yourself  1 3 0 2   Trouble concentrating 0 3 3 3    Moving slowly or fidgety/restless 0 0 0 0   Suicidal thoughts 0 0 0 0   PHQ-9 Score 9 11 11  11  Difficult doing work/chores Very difficult Not difficult at all Somewhat difficult Somewhat difficult       05/07/2023    9:20 AM 04/23/2023    9:02 AM 04/08/2023    4:45 PM 03/24/2023    3:19 PM  GAD 7 : Generalized Anxiety Score  Nervous, Anxious, on Edge 3 1 1 1   Control/stop worrying 2 1 1 1   Worry too much - different things 2 1 1 1   Trouble relaxing 2 1 0 1   Restless 0 0 0 0  Easily annoyed or irritable 0 1 0 1  Afraid - awful might happen 3 1 1 1   Total GAD 7 Score 12 6 4 6   Anxiety Difficulty Somewhat difficult Not difficult at all Very difficult Somewhat difficult   Patient made minimal progress on identified tx goals at this time.   Suicidal/Homicidal: No  Therapist Response: Clinician utilized CBT, MI, supportive reflection techniques to support pt in navigating thoughts and feelings surrounding presenting stressors and sxs. Re-administered PHQ9 and GAD7, engaging pt in further exploration of recent increase in severity of anxious sxs. Supported pt in processing concerns surrounding attempted break into her home, daughter's comments and concerns of pt's potential dementia, and pt's continued increased depressive behaviors. Revisited pt's efforts to attend local [employer] in order to increase social interaction and activity, the barriers to pt completing daily tasks, and engaged pt in identified 3 things pt can complete over the coming days to activate behaviors including taking trash out in room, taking bathroom trash out, and washing the dishes. Therapist provided support and empathy to patient during session.  Plan: Return again in 1 weeks.  Diagnosis:  Encounter Diagnosis  Name Primary?   Moderate episode of recurrent major depressive disorder (HCC) Yes       Collaboration of Care: Other none necessary at this time.  Patient/Guardian was advised Release of Information must be obtained prior to any record release in order to collaborate their care with an outside provider. Patient/Guardian was advised if they have not already done so to contact the registration department to sign all necessary forms in order for [place] to release information regarding their care.   Consent: Patient/Guardian gives verbal consent for treatment and assignment of benefits for services provided during this visit. Patient/Guardian expressed understanding and agreed  to proceed.   Leisa Lenz, MSW, LCSW 05/07/2023,  10:00 AM

## 2023-05-14 ENCOUNTER — Other Ambulatory Visit (HOSPITAL_COMMUNITY): Payer: Self-pay

## 2023-05-14 ENCOUNTER — Ambulatory Visit (INDEPENDENT_AMBULATORY_CARE_PROVIDER_SITE_OTHER): Payer: MEDICAID | Admitting: Licensed Clinical Social Worker

## 2023-05-14 DIAGNOSIS — F331 Major depressive disorder, recurrent, moderate: Secondary | ICD-10-CM

## 2023-05-14 DIAGNOSIS — F4312 Post-traumatic stress disorder, chronic: Secondary | ICD-10-CM

## 2023-05-14 NOTE — Progress Notes (Unsigned)
THERAPIST PROGRESS NOTE  Session Date: 05/14/23 Session Time: 0915 - 1000 Virtual Visit via Video Note  Virtual Visit via Video Note  I connected with Lynn [name] on 05/14/23 at  9:15 AM EST by a video enabled telemedicine application and verified that I am speaking with the correct person using two identifiers.  Location: Patient: Home Provider: [facility]   I discussed the limitations of evaluation and management by telemedicine and the availability of in person appointments. The patient expressed understanding and agreed to proceed.  The patient was advised to call back or seek an in-person evaluation if the symptoms worsen or if the condition fails to improve as anticipated.  I provided 45 minutes of non-face-to-face time during this encounter.  Participation Level: Active  Behavioral Response: Casual and Fairly GroomedAlertAnxious, Depressed, and flat  Type of Therapy: Individual Therapy  Treatment Goals addressed: - Aaruhi will reduce frequency of avoidant behaviors by 50% as evidenced by self report in therapy sessions - Cornelius will identify cognitive patterns and beliefs that support depression  - Reduce overall depression score by a minimum of 25% on the Patient Health Questionnaire (PHQ-9)  - Increase coping skills to manage depression and improve ability to perform daily activities - Reduce frequency, intensity, and duration of depression symptoms so that daily functioning is improved  - Report a decrease in anxiety symptoms as evidenced by an overall reduction in anxiety score by a minimum of 25% on the Generalized Anxiety Disorder Scale (GAD-7)  - Micalah will score less than 5 on the Generalized Anxiety Disorder 7 Scale (GAD-7)   ProgressTowards Goals: Not Progressing  Interventions: CBT, Motivational Interviewing, and Supportive  Summary: Lynn [name] is a 62 year old African-American female who presents for follow-up therapy appointment in the continued  management of depressive symptoms. Pt presented in depressed moods and flat affect. ***. Patient continues to meet criteria for Moderate episode of recurrent major depressive disorder (HCC) . Patient will continue to benefit from engagement in outpatient therapy due to being the least restrictive service to meet presenting needs.       05/07/2023    9:29 AM 04/23/2023    8:56 AM 04/08/2023    4:49 PM 03/24/2023    3:20 PM 11/23/2022    7:58 AM  Depression screen PHQ 2/9  Decreased Interest 2 0 3 1 1   Down, Depressed, Hopeless 1 1 0 1 1  PHQ - 2 Score 3 1 3 2 2   Altered sleeping 2 1 1 2    Tired, decreased energy 2 1 1 1    Change in appetite 1 2 3 1    Feeling bad or failure about yourself  1 3 0 2   Trouble concentrating 0 3 3 3    Moving slowly or fidgety/restless 0 0 0 0   Suicidal thoughts 0 0 0 0   PHQ-9 Score 9 11 11 11    Difficult doing work/chores Very difficult Not difficult at all Somewhat difficult Somewhat difficult       05/07/2023    9:20 AM 04/23/2023    9:02 AM 04/08/2023    4:45 PM 03/24/2023    3:19 PM  GAD 7 : Generalized Anxiety Score  Nervous, Anxious, on Edge 3 1 1 1   Control/stop worrying 2 1 1 1   Worry too much - different things 2 1 1 1   Trouble relaxing 2 1 0 1  Restless 0 0 0 0  Easily annoyed or irritable 0 1 0 1  Afraid - awful might happen  3 1 1 1   Total GAD 7 Score 12 6 4 6   Anxiety Difficulty Somewhat difficult Not difficult at all Very difficult Somewhat difficult   Patient made minimal progress on identified tx goals at this time.   Suicidal/Homicidal: No  Therapist Response: Clinician utilized CBT, MI, supportive reflection techniques to support pt in navigating thoughts and feelings surrounding presenting stressors and sxs. ***. Revisited pt's efforts to attend local [employer] in order to increase social interaction and activity, the barriers to pt completing daily tasks, and engaged pt in identified 3 things pt can complete over the coming days to  activate behaviors including taking trash out in room, taking bathroom trash out, and washing the dishes. Therapist provided support and empathy to patient during session.  Plan: Return again in 1 weeks.  Diagnosis:  Encounter Diagnoses  Name Primary?   Moderate episode of recurrent major depressive disorder (HCC) Yes   Chronic post-traumatic stress disorder (PTSD)        Collaboration of Care: Other none necessary at this time.  Patient/Guardian was advised Release of Information must be obtained prior to any record release in order to collaborate their care with an outside provider. Patient/Guardian was advised if they have not already done so to contact the registration department to sign all necessary forms in order for [place] to release information regarding their care.   Consent: Patient/Guardian gives verbal consent for treatment and assignment of benefits for services provided during this visit. Patient/Guardian expressed understanding and agreed to proceed.   Leisa Lenz, MSW, LCSW 05/14/2023,  9:27 AM

## 2023-05-18 ENCOUNTER — Encounter (HOSPITAL_COMMUNITY): Payer: Self-pay | Admitting: Psychiatry

## 2023-05-18 ENCOUNTER — Telehealth (HOSPITAL_COMMUNITY): Payer: MEDICAID | Admitting: Psychiatry

## 2023-05-18 VITALS — Wt 205.0 lb

## 2023-05-18 DIAGNOSIS — F4312 Post-traumatic stress disorder, chronic: Secondary | ICD-10-CM

## 2023-05-18 DIAGNOSIS — F331 Major depressive disorder, recurrent, moderate: Secondary | ICD-10-CM | POA: Diagnosis not present

## 2023-05-18 MED ORDER — LURASIDONE HCL 40 MG PO TABS
40.0000 mg | ORAL_TABLET | Freq: Every day | ORAL | 2 refills | Status: DC
Start: 2023-05-18 — End: 2023-08-17

## 2023-05-18 MED ORDER — FLUOXETINE HCL 20 MG PO CAPS
20.0000 mg | ORAL_CAPSULE | Freq: Every day | ORAL | 2 refills | Status: DC
Start: 2023-05-18 — End: 2023-08-17
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
## 2023-05-21 ENCOUNTER — Ambulatory Visit (INDEPENDENT_AMBULATORY_CARE_PROVIDER_SITE_OTHER): Payer: MEDICAID | Admitting: Licensed Clinical Social Worker

## 2023-05-21 DIAGNOSIS — F331 Major depressive disorder, recurrent, moderate: Secondary | ICD-10-CM

## 2023-05-21 NOTE — Progress Notes (Unsigned)
THERAPIST PROGRESS NOTE  Session Date: 05/21/23 Session Time: 0909 - 4098 Virtual Visit via Video Note  Virtual Visit via Video Note  I connected with Lynn [name] on 05/21/23 at  9:09 AM EST by a video enabled telemedicine application and verified that I am speaking with the correct person using two identifiers.  Location: Patient: Community location Provider: [facility]   I discussed the limitations of evaluation and management by telemedicine and the availability of in person appointments. The patient expressed understanding and agreed to proceed.  The patient was advised to call back or seek an in-person evaluation if the symptoms worsen or if the condition fails to improve as anticipated.  I provided 42 minutes of non-face-to-face time during this encounter.  Participation Level: Active  Behavioral Response: Casual and Fairly GroomedAlertEuthymic  Type of Therapy: Individual Therapy  Treatment Goals addressed: - Muna will reduce frequency of avoidant behaviors by 50% as evidenced by self report in therapy sessions - Tristina will identify cognitive patterns and beliefs that support depression  - Reduce overall depression score by a minimum of 25% on the Patient Health Questionnaire (PHQ-9)  - Increase coping skills to manage depression and improve ability to perform daily activities - Reduce frequency, intensity, and duration of depression symptoms so that daily functioning is improved  - Report a decrease in anxiety symptoms as evidenced by an overall reduction in anxiety score by a minimum of 25% on the Generalized Anxiety Disorder Scale (GAD-7)  - Garnita will score less than 5 on the Generalized Anxiety Disorder 7 Scale (GAD-7)   ProgressTowards Goals: Not Progressing  Interventions: CBT, Motivational Interviewing, and Supportive  Summary: Lynn [name] is a 62 year old African-American female who presents for follow-up therapy appointment in the continued  management of depressive symptoms. Pt presented in pleasant moods, sharing of currently being out of the home running errands. Pt actively reflected on past  week since previous session, sharing of continued avoidance of completing tasks of self-care and grooming due to lack of interest, specifically in grooming and cleaning her home. Further processed understanding of thoughts and feelings and how pt's thoughts and behaviors such as isolation and avoidance continue to support depression. Explored pt's feelings surrounding upcoming holiday's and overall negative outlook towards events, processing how perspectives and thoughts of pessimism impact experiences. Explored how continued avoidant behaviors proves to impact social interaction and engagement with others. Patient continues to meet criteria for Moderate episode of recurrent major depressive disorder (HCC) . Patient will continue to benefit from engagement in outpatient therapy due to being the least restrictive service to meet presenting needs.       05/07/2023    9:29 AM 04/23/2023    8:56 AM 04/08/2023    4:49 PM 03/24/2023    3:20 PM 11/23/2022    7:58 AM  Depression screen PHQ 2/9  Decreased Interest 2 0 3 1 1   Down, Depressed, Hopeless 1 1 0 1 1  PHQ - 2 Score 3 1 3 2 2   Altered sleeping 2 1 1 2    Tired, decreased energy 2 1 1 1    Change in appetite 1 2 3 1    Feeling bad or failure about yourself  1 3 0 2   Trouble concentrating 0 3 3 3    Moving slowly or fidgety/restless 0 0 0 0   Suicidal thoughts 0 0 0 0   PHQ-9 Score 9 11 11 11    Difficult doing work/chores Very difficult Not difficult at all Somewhat difficult  Somewhat difficult       05/07/2023    9:20 AM 04/23/2023    9:02 AM 04/08/2023    4:45 PM 03/24/2023    3:19 PM  GAD 7 : Generalized Anxiety Score  Nervous, Anxious, on Edge 3 1 1 1   Control/stop worrying 2 1 1 1   Worry too much - different things 2 1 1 1   Trouble relaxing 2 1 0 1  Restless 0 0 0 0  Easily  annoyed or irritable 0 1 0 1  Afraid - awful might happen 3 1 1 1   Total GAD 7 Score 12 6 4 6   Anxiety Difficulty Somewhat difficult Not difficult at all Very difficult Somewhat difficult   Patient made minimal progress on identified tx goals at this time.   Suicidal/Homicidal: No  Therapist Response: Clinician utilized CBT, MI, supportive reflection techniques to support pt in navigating thoughts and feelings surrounding presenting stressors and sxs. Actively listened to pt's recounts of the past week, eliciting pt's thoughts and feelings surrounding approaching holidays, identified stressors, and continued feelings of embarrassment surrounding previous interactions and comments from daughter. Engaged pt in processing relationship between thoughts, feelings, and behaviors, and pt's prolonged hx of negative thoughts resulting in depressed moods, and behaviors proving to support depression. Therapist provided support and empathy to patient during session.  Plan: Return again in 1 weeks.  Diagnosis:  Encounter Diagnosis  Name Primary?   Moderate episode of recurrent major depressive disorder (HCC) Yes        Collaboration of Care: Other none necessary at this time.  Patient/Guardian was advised Release of Information must be obtained prior to any record release in order to collaborate their care with an outside provider. Patient/Guardian was advised if they have not already done so to contact the registration department to sign all necessary forms in order for [place] to release information regarding their care.   Consent: Patient/Guardian gives verbal consent for treatment and assignment of benefits for services provided during this visit. Patient/Guardian expressed understanding and agreed to proceed.   Leisa Lenz, MSW, LCSW 05/21/2023,  9:10 AM

## 2023-05-31 ENCOUNTER — Ambulatory Visit (INDEPENDENT_AMBULATORY_CARE_PROVIDER_SITE_OTHER): Payer: MEDICAID | Admitting: Licensed Clinical Social Worker

## 2023-05-31 DIAGNOSIS — F331 Major depressive disorder, recurrent, moderate: Secondary | ICD-10-CM

## 2023-05-31 DIAGNOSIS — F4312 Post-traumatic stress disorder, chronic: Secondary | ICD-10-CM | POA: Diagnosis not present
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
## 2023-06-10 ENCOUNTER — Ambulatory Visit (INDEPENDENT_AMBULATORY_CARE_PROVIDER_SITE_OTHER): Payer: MEDICAID | Admitting: Licensed Clinical Social Worker

## 2023-06-10 DIAGNOSIS — F331 Major depressive disorder, recurrent, moderate: Secondary | ICD-10-CM

## 2023-06-10 NOTE — Progress Notes (Signed)
 THERAPIST PROGRESS NOTE   Session Date: 06/10/2023  Session Time: 1000 - 1050  Virtual Visit via Video Note  I connected with Roseline Public on 06/10/23 at 10:00 AM EST by a video enabled telemedicine application and verified that I am speaking with the correct person using two identifiers.  Location: Patient: Home Provider: [facility]   I discussed the limitations of evaluation and management by telemedicine and the availability of in person appointments. The patient expressed understanding and agreed to proceed.  The patient was advised to call back or seek an in-person evaluation if the symptoms worsen or if the condition fails to improve as anticipated.  I provided 45 minutes of non-face-to-face time during this encounter.   Participation Level: Active  Behavioral Response: Casual and Fairly GroomedAlertEuthymic  Type of Therapy: Individual Therapy  Treatment Goals addressed: - Doranne will reduce frequency of avoidant behaviors by 50% as evidenced by self report in therapy sessions - Jaquia will identify cognitive patterns and beliefs that support depression  - Reduce overall depression score by a minimum of 25% on the Patient Health Questionnaire (PHQ-9)  - Increase coping skills to manage depression and improve ability to perform daily activities - Reduce frequency, intensity, and duration of depression symptoms so that daily functioning is improved  - Report a decrease in anxiety symptoms as evidenced by an overall reduction in anxiety score by a minimum of 25% on the Generalized Anxiety Disorder Scale (GAD-7)  - Rylen will score less than 5 on the Generalized Anxiety Disorder 7 Scale (GAD-7)   ProgressTowards Goals: Not Progressing  Interventions: CBT, Motivational Interviewing, and Supportive  Summary: Tiffney Haughton is a 63 year old African-American female who presents for follow-up therapy appointment in the continued management of depressive symptoms. Pt  presented in pleasant moods and congruent affect, maintained throughout session, providing detailed recounts of events of the past two weeks, sharing of New Year rituals of cleaning her home, doing laundry, changing/washing sheets, and washing and dying her hair. Explored how completing tasks make pt feel and any observed improvements in moods, having completed tasks pt had proven to identify as desirable over recent 1-2 months. Confirmed observed improved moods, detailing recent stressors following recent PCP visit on 1/7, sharing of labs being fine, however noted having gained 20lb over the last yr, resulting in meeting with nutritionist and determined weight gain is a result of consumption via drinks. Plans on drinking more water and stopping drinking soda slushies and sweet tea throughout the day. Revisited previously noted concerns surrounding dementia and forgetting mention to PCP but intent to reach out via mychart to provider to express concern and share additional concerns surrounding hair falling out. Revisited efforts to attend local [employer] to apply for membership/programs, and having called and learning of the need to apply via website, and inquiring of supporting to do so in future sessions.  Patient responded well to interventions. Patient continues to meet criteria for Moderate episode of recurrent major depressive disorder (HCC) . Patient will continue to benefit from engagement in outpatient therapy due to being the least restrictive service to meet presenting needs.      05/31/2023   10:03 AM 05/07/2023    9:29 AM 04/23/2023    8:56 AM 04/08/2023    4:49 PM 03/24/2023    3:20 PM  Depression screen PHQ 2/9  Decreased Interest 3 2 0 3 1  Down, Depressed, Hopeless 1 1 1  0 1  PHQ - 2 Score 4 3 1 3  2  Altered sleeping 2 2 1 1 2   Tired, decreased energy 3 2 1 1 1   Change in appetite 1 1 2 3 1   Feeling bad or failure about yourself  1 1 3  0 2  Trouble concentrating 0 0 3 3 3   Moving  slowly or fidgety/restless 0 0 0 0 0  Suicidal thoughts 0 0 0 0 0  PHQ-9 Score 11 9 11 11 11   Difficult doing work/chores Somewhat difficult Very difficult Not difficult at all Somewhat difficult Somewhat difficult      05/31/2023    9:52 AM 05/07/2023    9:20 AM 04/23/2023    9:02 AM 04/08/2023    4:45 PM  GAD 7 : Generalized Anxiety Score  Nervous, Anxious, on Edge 1 3 1 1   Control/stop worrying 1 2 1 1   Worry too much - different things 0 2 1 1   Trouble relaxing 1 2 1  0  Restless 0 0 0 0  Easily annoyed or irritable 1 0 1 0  Afraid - awful might happen 1 3 1 1   Total GAD 7 Score 5 12 6 4   Anxiety Difficulty Somewhat difficult Somewhat difficult Not difficult at all Very difficult   Patient made minimal progress on identified tx goals at this time.   Suicidal/Homicidal: No  Therapist Response: Clinician utilized CBT, MI, supportive reflection techniques to support pt in navigating thoughts and feelings surrounding presenting stressors and sxs. Actively engaged pt in reflection of recent events, eliciting further details of events surrounding new year traditions, and evoking pt thoughts and feelings in relation to observations of improved moods and perspectives. Evoked pt's thoughts and perspectives surrounding learned information from PCP visit, and supported pt in processing and understanding contributing factors. Encouraged pt to re-connect with local [employer] to obtain clear details on how to approach application for membership and scholarship supports. Therapist provided support and empathy to patient during session.  Plan: Return again in 1 weeks.  Diagnosis:  Encounter Diagnosis  Name Primary?   Moderate episode of recurrent major depressive disorder (HCC) Yes    Collaboration of Care: Other none necessary at this time.  Patient/Guardian was advised Release of Information must be obtained prior to any record release in order to collaborate their care with an outside provider.  Patient/Guardian was advised if they have not already done so to contact the registration department to sign all necessary forms in order for us  to release information regarding their care.   Consent: Patient/Guardian gives verbal consent for treatment and assignment of benefits for services provided during this visit. Patient/Guardian expressed understanding and agreed to proceed.   [name], MSW, LCSW 06/10/2023,  10:05 AM

## 2023-06-17 ENCOUNTER — Ambulatory Visit (HOSPITAL_COMMUNITY): Payer: MEDICAID | Admitting: Licensed Clinical Social Worker

## 2023-06-17 DIAGNOSIS — F331 Major depressive disorder, recurrent, moderate: Secondary | ICD-10-CM | POA: Diagnosis not present

## 2023-06-17 DIAGNOSIS — F4312 Post-traumatic stress disorder, chronic: Secondary | ICD-10-CM

## 2023-06-17 NOTE — Progress Notes (Addendum)
THERAPIST PROGRESS NOTE   Session Date: 06/17/2023  Session Time: 1400 - 1507  Virtual Visit via Video Note  I connected with [name] on 06/17/23 at  2:00 PM EST by a video enabled telemedicine application and verified that I am speaking with the correct person using two identifiers.  Location: Patient: Home Provider: [facility]   I discussed the limitations of evaluation and management by telemedicine and the availability of in person appointments. The patient expressed understanding and agreed to proceed.  The patient was advised to call back or seek an in-person evaluation if the symptoms worsen or if the condition fails to improve as anticipated.  I provided 67 minutes of non-face-to-face time during this encounter.   Participation Level: Active  Behavioral Response: CasualAlertAnxious and Irritable  Type of Therapy: Individual Therapy  Treatment Goals addressed: - Glenys will reduce frequency of avoidant behaviors by 50% as evidenced by self report in therapy sessions - Rekia will identify cognitive patterns and beliefs that support depression  - Reduce overall depression score by a minimum of 25% on the Patient Health Questionnaire (PHQ-9)  - Increase coping skills to manage depression and improve ability to perform daily activities - Reduce frequency, intensity, and duration of depression symptoms so that daily functioning is improved  - Report a decrease in anxiety symptoms as evidenced by an overall reduction in anxiety score by a minimum of 25% on the Generalized Anxiety Disorder Scale (GAD-7)  - Tanetta will score less than 5 on the Generalized Anxiety Disorder 7 Scale (GAD-7)   ProgressTowards Goals: Not Progressing  Interventions: CBT, Motivational Interviewing, and Supportive  Summary: Averianna Ghaffari is a 63 year old African-American female who presents for follow-up therapy appointment in the continued management of depressive symptoms. Pt  presented in anxious and irritable moods throughout with congruent affect and periods of tearfulness.  Actively provided recounts of events over the past week surrounding interactions with granddaughters, efforts to connect granddaughters due to mutual interactions and interests, and conflict with daughter as a result.  Actively reflected on relationship with daughter throughout the past 10 to 15 years, and level of stress relationship creates for patient.  Explored patient's lack of boundaries throughout history of relationship with daughter, resulting in continued conflict, and disrespect towards patient.  Explored patient's thoughts, feelings, and perspectives in relation to relationship and appropriate boundaries, further exploring history and contributing factors to continue his conflicts and hostility within relationship.  Explored understanding of responsibility and patient's misperception of her being responsible for daughters behaviors and actions.  Processed patient's thoughts surrounding boundaries and the appropriateness of implementing boundaries in all relationships to maintain respect, reduce stress and challenges, and maintain emotional regulation.  Patient responded well to interventions. Patient continues to meet criteria for Moderate episode of recurrent major depressive disorder (HCC) . Patient will continue to benefit from engagement in outpatient therapy due to being the least restrictive service to meet presenting needs.      06/10/2023   10:38 AM 05/31/2023   10:03 AM 05/07/2023    9:29 AM 04/23/2023    8:56 AM 04/08/2023    4:49 PM  Depression screen PHQ 2/9  Decreased Interest 3 3 2  0 3  Down, Depressed, Hopeless 0 1 1 1  0  PHQ - 2 Score 3 4 3 1 3   Altered sleeping 2 2 2 1 1   Tired, decreased energy 1 3 2 1 1   Change in appetite 3 1 1 2 3   Feeling bad or failure  about yourself  1 1 1 3  0  Trouble concentrating 0 0 0 3 3  Moving slowly or fidgety/restless 0 0 0 0 0   Suicidal thoughts 0 0 0 0 0  PHQ-9 Score 10 11 9 11 11   Difficult doing work/chores Not difficult at all Somewhat difficult Very difficult Not difficult at all Somewhat difficult      06/10/2023   10:34 AM 05/31/2023    9:52 AM 05/07/2023    9:20 AM 04/23/2023    9:02 AM  GAD 7 : Generalized Anxiety Score  Nervous, Anxious, on Edge 0 1 3 1   Control/stop worrying 1 1 2 1   Worry too much - different things 1 0 2 1  Trouble relaxing 0 1 2 1   Restless 0 0 0 0  Easily annoyed or irritable 1 1 0 1  Afraid - awful might happen 1 1 3 1   Total GAD 7 Score 4 5 12 6   Anxiety Difficulty Not difficult at all Somewhat difficult Somewhat difficult Not difficult at all   Suicidal/Homicidal: No  Therapist Response: Clinician utilized CBT, MI, supportive reflection techniques to support pt in navigating thoughts and feelings surrounding presenting stressors and sxs. Actively engaged with patient in support of her reflections of events since previous session, eliciting patient's recounts of stressors and challenges faced over the past week.  Further evoked patient's thoughts, feelings, and perspectives, challenging patient's thoughts and exploring efforts at reframing thoughts and perspectives in relation to patient's responsibility to and/or for daughter's behaviors, emotions, and attitudes.  Evoked patient's perspectives and readiness towards implementation of healthy boundaries within relationship in order to maintain mood regulation, and avoid challenging interactions with daughter.  Encouraged patient to spend time reflecting and processing over the coming week on how she may approach implementing healthy boundaries within relationship.  Clinician reassessed severity of presenting sxs, and presence of any safety concerns. Therapist provided support and empathy to patient during session.  Plan: Return again in 1 weeks.  Diagnosis:  Encounter Diagnosis  Name Primary?   Moderate episode of recurrent  major depressive disorder (HCC) Yes    Collaboration of Care: Other none necessary at this time.  Patient/Guardian was advised Release of Information must be obtained prior to any record release in order to collaborate their care with an outside provider. Patient/Guardian was advised if they have not already done so to contact the registration department to sign all necessary forms in order for [place] to release information regarding their care.   Consent: Patient/Guardian gives verbal consent for treatment and assignment of benefits for services provided during this visit. Patient/Guardian expressed understanding and agreed to proceed.   Leisa Lenz, MSW, LCSW 06/17/2023,  2:03 PM

## 2023-06-21 ENCOUNTER — Ambulatory Visit (HOSPITAL_COMMUNITY): Payer: MEDICAID | Admitting: Licensed Clinical Social Worker

## 2023-06-24 ENCOUNTER — Ambulatory Visit (HOSPITAL_COMMUNITY): Payer: MEDICAID | Admitting: Licensed Clinical Social Worker

## 2023-06-24 DIAGNOSIS — F4312 Post-traumatic stress disorder, chronic: Secondary | ICD-10-CM

## 2023-06-24 DIAGNOSIS — F331 Major depressive disorder, recurrent, moderate: Secondary | ICD-10-CM

## 2023-06-24 NOTE — Progress Notes (Signed)
THERAPIST PROGRESS NOTE   Session Date: 06/24/2023  Session Time: 6213 - 1006  Virtual Visit via Video Note  I connected with [name] on 06/24/23 at 9:16 AM by a video enabled telemedicine application and verified that I am speaking with the correct person using two identifiers.  Location: Patient: Home Provider: Home Office   I discussed the limitations of evaluation and management by telemedicine and the availability of in person appointments. The patient expressed understanding and agreed to proceed.  The patient was advised to call back or seek an in-person evaluation if the symptoms worsen or if the condition fails to improve as anticipated.  I provided 50 minutes of non-face-to-face time during this encounter.  Participation Level: Active  Behavioral Response: DisheveledAlert and DrowsyAnxious and tangential  Type of Therapy: Individual Therapy  Treatment Goals addressed: - [name] will reduce frequency of avoidant behaviors by 50% as evidenced by self report in therapy sessions - Paigelyn will identify cognitive patterns and beliefs that support depression  - Reduce overall depression score by a minimum of 25% on the Patient Health Questionnaire (PHQ-9)  - Increase coping skills to manage depression and improve ability to perform daily activities - Reduce frequency, intensity, and duration of depression symptoms so that daily functioning is improved  - Report a decrease in anxiety symptoms as evidenced by an overall reduction in anxiety score by a minimum of 25% on the Generalized Anxiety Disorder Scale (GAD-7)  - Telina will score less than 5 on the Generalized Anxiety Disorder 7 Scale (GAD-7)   ProgressTowards Goals: Not Progressing  Interventions: CBT, Motivational Interviewing, and Supportive  Summary: Nivedita Atteberry is a 63 year old African-American female who presents for follow-up therapy appointment in the continued management of depressive symptoms. Pt  presented in variable moods throughout with congruent affect, presenting initially as increasingly tired, reporting of having just woken, sharing of typically sleeping during the day and staying awake throughout the night..  Patient requested to explore possibly switching appointments to afternoon times, however after determining there being no availability of afternoon appointments until late February patient proved receptive to keeping future appointments scheduled as is.  Patient engaged in exploration of recent events, sharing of challenging interactions between she and her daughter and one of her sons regarding the need to borrow money and patient's continued challenges with implementing boundaries surrounding such factors.  Revisited previous discussed factors related to boundaries and interactions with daughter, specifically citing communication between daughter and patient and patient's dislike for the way in which her daughter talks to and treats her.  Patient inquired as to whether there are means in which boundaries can be implemented without having to communicate as such, resulting in exploration of patient's past efforts at terminating various relationships with others when she felt to be mistreated.  Explored benefits of beginning journaling thoughts and moods and identifying 1-3 points of gratitude.  Patient responded well to interventions. Patient continues to meet criteria for Moderate episode of recurrent major depressive disorder (HCC) . Patient will continue to benefit from engagement in outpatient therapy due to being the least restrictive service to meet presenting needs.      06/10/2023   10:38 AM 05/31/2023   10:03 AM 05/07/2023    9:29 AM 04/23/2023    8:56 AM 04/08/2023    4:49 PM  Depression screen PHQ 2/9  Decreased Interest 3 3 2  0 3  Down, Depressed, Hopeless 0 1 1 1  0  PHQ - 2 Score 3 4 3 1  3  Altered sleeping 2 2 2 1 1   Tired, decreased energy 1 3 2 1 1   Change in  appetite 3 1 1 2 3   Feeling bad or failure about yourself  1 1 1 3  0  Trouble concentrating 0 0 0 3 3  Moving slowly or fidgety/restless 0 0 0 0 0  Suicidal thoughts 0 0 0 0 0  PHQ-9 Score 10 11 9 11 11   Difficult doing work/chores Not difficult at all Somewhat difficult Very difficult Not difficult at all Somewhat difficult      06/10/2023   10:34 AM 05/31/2023    9:52 AM 05/07/2023    9:20 AM 04/23/2023    9:02 AM  GAD 7 : Generalized Anxiety Score  Nervous, Anxious, on Edge 0 1 3 1   Control/stop worrying 1 1 2 1   Worry too much - different things 1 0 2 1  Trouble relaxing 0 1 2 1   Restless 0 0 0 0  Easily annoyed or irritable 1 1 0 1  Afraid - awful might happen 1 1 3 1   Total GAD 7 Score 4 5 12 6   Anxiety Difficulty Not difficult at all Somewhat difficult Somewhat difficult Not difficult at all   Suicidal/Homicidal: No  Therapist Response: Clinician utilized CBT, MI, supportive reflection techniques to support pt in navigating thoughts and feelings surrounding presenting stressors and sxs. Actively engaged patient in check-in, assessing moods and affect.  Acknowledged patient's concerns surrounding schedule challenges related to early morning appointments and explored availability of afternoon appointments as a means to support patient.  Prompted patient to detail recent events of past week since previous session, actively listening to patient recounts of events occurring between she and 2 of her children.  Further evoked patient's thoughts, feelings, and perspectives surrounding challenging interactions, and outlooks.  Revisited previous discussion surrounding boundaries and patient's challenges with navigating and implementing boundaries, encouraging patient to spend some time considering how boundaries could improve relationships.  Prompted patient to revisit frequency of journaling and tracking moods as well as identifying points of gratitude.  Clinician reassessed severity of  presenting sxs, and presence of any safety concerns. Therapist provided support and empathy to patient during session.  Plan: Return again in 1 weeks.  Diagnosis:  Encounter Diagnoses  Name Primary?   Moderate episode of recurrent major depressive disorder (HCC) Yes   Chronic post-traumatic stress disorder (PTSD)      Collaboration of Care: Other none necessary at this time.  Patient/Guardian was advised Release of Information must be obtained prior to any record release in order to collaborate their care with an outside provider. Patient/Guardian was advised if they have not already done so to contact the registration department to sign all necessary forms in order for [place] to release information regarding their care.   Consent: Patient/Guardian gives verbal consent for treatment and assignment of benefits for services provided during this visit. Patient/Guardian expressed understanding and agreed to proceed.   Leisa Lenz, MSW, LCSW 06/24/2023,  9:16 AM

## 2023-06-28 ENCOUNTER — Ambulatory Visit (INDEPENDENT_AMBULATORY_CARE_PROVIDER_SITE_OTHER): Payer: MEDICAID | Admitting: Licensed Clinical Social Worker

## 2023-06-28 DIAGNOSIS — F331 Major depressive disorder, recurrent, moderate: Secondary | ICD-10-CM | POA: Diagnosis not present

## 2023-06-28 DIAGNOSIS — F4312 Post-traumatic stress disorder, chronic: Secondary | ICD-10-CM

## 2023-06-28 NOTE — Progress Notes (Signed)
THERAPIST PROGRESS NOTE   Session Date: 06/28/2023  Session Time: 1004 - 1054  Virtual Visit via Video Note  I connected with Lynn [name] on 06/28/23 at 10:04 AM by a video enabled telemedicine application and verified that I am speaking with the correct person using two identifiers.  Location: Patient: Home Provider: [facility]   I discussed the limitations of evaluation and management by telemedicine and the availability of in person appointments. The patient expressed understanding and agreed to proceed.  The patient was advised to call back or seek an in-person evaluation if the symptoms worsen or if the condition fails to improve as anticipated.  I provided 50 minutes of non-face-to-face time during this encounter.  Participation Level: Active  Behavioral Response: DisheveledAlert and DrowsyAnxious and tangential  Type of Therapy: Individual Therapy  Treatment Goals addressed: - Minervia will reduce frequency of avoidant behaviors by 50% as evidenced by self report in therapy sessions - Subrena will identify cognitive patterns and beliefs that support depression  - Reduce overall depression score by a minimum of 25% on the Patient Health Questionnaire (PHQ-9)  - Increase coping skills to manage depression and improve ability to perform daily activities - Reduce frequency, intensity, and duration of depression symptoms so that daily functioning is improved - Report a decrease in anxiety symptoms as evidenced by an overall reduction in anxiety score by a minimum of 25% on the Generalized Anxiety Disorder Scale (GAD-7) MET - Kenyia will score less than 5 on the Generalized Anxiety Disorder 7 Scale (GAD-7) MET  ProgressTowards Goals: Progressing  Interventions: CBT, Motivational Interviewing, and Supportive  Summary: Lynn [name] is a 63 year old African-American female who presents for follow-up therapy appointment in the continued management of depressive symptoms. Pt  presented in pleasant moods and congruent affect throughout.  Patient requested clinician support in getting my chart access on her laptop to be able to connect to sessions via her laptop instead of her phone or tablet, however this proved too complicated to complete.  Patient shared of inabilities and beginning journaling, however expressed intense of doing so.  Actively engaged in completion of PHQ-9 and GAD-7, exploring variances in Scaling's in processing overall improvement in feelings and moods.  Engaged in review of individualized goals and treatment plan, identifying as to whether patient believes to be progressing, not progressing, and/or having met identified treatment goals.  Patient responded well to interventions. Patient continues to meet criteria for Moderate episode of recurrent major depressive disorder (HCC) . Patient will continue to benefit from engagement in outpatient therapy due to being the least restrictive service to meet presenting needs.      06/28/2023   10:29 AM 06/10/2023   10:38 AM 05/31/2023   10:03 AM 05/07/2023    9:29 AM 04/23/2023    8:56 AM  Depression screen PHQ 2/9  Decreased Interest 0 3 3 2  0  Down, Depressed, Hopeless 0 0 1 1 1   PHQ - 2 Score 0 3 4 3 1   Altered sleeping 0 2 2 2 1   Tired, decreased energy 0 1 3 2 1   Change in appetite 3 3 1 1 2   Feeling bad or failure about yourself  3 1 1 1 3   Trouble concentrating 0 0 0 0 3  Moving slowly or fidgety/restless 0 0 0 0 0  Suicidal thoughts 0 0 0 0 0  PHQ-9 Score 6 10 11 9 11   Difficult doing work/chores Not difficult at all Not difficult at all Somewhat difficult Very  difficult Not difficult at all      06/28/2023   10:27 AM 06/10/2023   10:34 AM 05/31/2023    9:52 AM 05/07/2023    9:20 AM  GAD 7 : Generalized Anxiety Score  Nervous, Anxious, on Edge 0 0 1 3  Control/stop worrying 1 1 1 2   Worry too much - different things 0 1 0 2  Trouble relaxing 0 0 1 2  Restless 0 0 0 0  Easily annoyed or  irritable 0 1 1 0  Afraid - awful might happen 0 1 1 3   Total GAD 7 Score 1 4 5 12   Anxiety Difficulty Not difficult at all Not difficult at all Somewhat difficult Somewhat difficult   Suicidal/Homicidal: No  Therapist Response: Clinician utilized CBT, MI, supportive reflection techniques to support pt in navigating thoughts and feelings surrounding presenting stressors and sxs. Actively engaged patient in check-in, assessing moods and affect.  Actively listened to patient's recounts of weekend, supporting patient and attempts to connect to session on the laptop instead of tablet.  Explored patient's efforts towards journaling, and beginning to navigate thoughts and feelings surrounding the relationship that she has with children.  Readministered PHQ-9 and GAD-7, further engaging patient in processing her perspective and noted progress.  Engage patient and review of treatment plan, documenting progression towards identified treatment goals.  Clinician reassessed severity of presenting sxs, and presence of any safety concerns. Therapist provided support and empathy to patient during session.  Plan: Return again in 2 weeks.  Diagnosis:  Encounter Diagnoses  Name Primary?   Moderate episode of recurrent major depressive disorder (HCC) Yes   Chronic post-traumatic stress disorder (PTSD)    Collaboration of Care: Other none necessary at this time.  Patient/Guardian was advised Release of Information must be obtained prior to any record release in order to collaborate their care with an outside provider. Patient/Guardian was advised if they have not already done so to contact the registration department to sign all necessary forms in order for [place] to release information regarding their care.   Consent: Patient/Guardian gives verbal consent for treatment and assignment of benefits for services provided during this visit. Patient/Guardian expressed understanding and agreed to proceed.   Leisa Lenz, MSW, LCSW 06/28/2023,  10:32 AM

## 2023-07-05 ENCOUNTER — Ambulatory Visit (HOSPITAL_COMMUNITY): Payer: MEDICAID | Admitting: Licensed Clinical Social Worker

## 2023-07-12 ENCOUNTER — Ambulatory Visit (INDEPENDENT_AMBULATORY_CARE_PROVIDER_SITE_OTHER): Payer: MEDICAID | Admitting: Licensed Clinical Social Worker

## 2023-07-12 DIAGNOSIS — F331 Major depressive disorder, recurrent, moderate: Secondary | ICD-10-CM | POA: Diagnosis not present

## 2023-07-12 NOTE — Progress Notes (Signed)
THERAPIST PROGRESS NOTE   Session Date: 07/12/2023  Session Time: 1008 - 1100  Virtual Visit via Video Note  I connected with Lynn [name] on 07/12/23 at 10:08 AM by a video enabled telemedicine application and verified that I am speaking with the correct person using two identifiers.  Location: Patient: Home Provider: [facility]   I discussed the limitations of evaluation and management by telemedicine and the availability of in person appointments. The patient expressed understanding and agreed to proceed.  The patient was advised to call back or seek an in-person evaluation if the symptoms worsen or if the condition fails to improve as anticipated.  I provided 52 minutes of non-face-to-face time during this encounter.  Participation Level: Active  Behavioral Response: DisheveledDrowsy and LethargicAnxious and Depressed  Type of Therapy: Individual Therapy  Treatment Goals addressed: - Lynn [name] will reduce frequency of avoidant behaviors by 50% as evidenced by self report in therapy sessions - Lynn [name] will identify cognitive patterns and beliefs that support depression  - Reduce overall depression score by a minimum of 25% on the Patient Health Questionnaire (PHQ-9)  - Increase coping skills to manage depression and improve ability to perform daily activities - Reduce frequency, intensity, and duration of depression symptoms so that daily functioning is improved - Report a decrease in anxiety symptoms as evidenced by an overall reduction in anxiety score by a minimum of 25% on the Generalized Anxiety Disorder Scale (GAD-7) MET - Lynn [name] will score less than 5 on the Generalized Anxiety Disorder 7 Scale (GAD-7) MET  ProgressTowards Goals: Progressing  Interventions: CBT, Motivational Interviewing, and Supportive  Summary: Lynn [name] is a 63 year old African-American female who presents for follow-up therapy appointment in the continued management of depressive symptoms.  Pt presented in depressed moods and congruent affect throughout. Re-administered PHQ-9 and GAD-7, engaging in processing of scores and reflecting on continued downward trends.  - Pt engaged in session, presenting in depressed moods and affect exhibiting sxs of laying down, tired, depressed moods. - Reports things have been "rough, very rough", further detailing son that has been residing with her came home sick, reports not wanting to take care of son as didn't want to get sick, concerns as son won't wear a mask, got son out of house, deep cleaned house, reports hasn't eaten or taken medications in almost a week as was scared to get sick.  - Engaged pt in processing cognitive distortions and results of distorted thinking leading to pt having irrational thoughts surrounding getting sick. - Reported additional stressors related to oldest son needing money to eat, as well as daughter calling to ask for money towards planned trip. Processed boundaries related to relationships with children and the lack of abilities to tell them "No". Reports of not doing what she needs to do for herself, specifically a required oil change and new phone, out of fear that children will continue to call her for their needs. - Further processed boundaries within relationships with children, allowing behaviors to continue, with pt identifying she feels she may be enabling children.  Patient responded well to interventions. Patient continues to meet criteria for MDD. Patient will continue to benefit from engagement in outpatient therapy due to being the least restrictive service to meet presenting needs.      07/12/2023   10:40 AM 06/28/2023   10:29 AM 06/10/2023   10:38 AM 05/31/2023   10:03 AM 05/07/2023    9:29 AM  Depression screen PHQ 2/9  Decreased Interest 1 0 3  3 2  Down, Depressed, Hopeless 0 0 0 1 1  PHQ - 2 Score 1 0 3 4 3   Altered sleeping 0 0 2 2 2   Tired, decreased energy 0 0 1 3 2   Change in appetite 0 3 3 1  1   Feeling bad or failure about yourself  0 3 1 1 1   Trouble concentrating 0 0 0 0 0  Moving slowly or fidgety/restless 0 0 0 0 0  Suicidal thoughts 0 0 0 0 0  PHQ-9 Score 1 6 10 11 9   Difficult doing work/chores Somewhat difficult Not difficult at all Not difficult at all Somewhat difficult Very difficult      07/12/2023   10:37 AM 06/28/2023   10:27 AM 06/10/2023   10:34 AM 05/31/2023    9:52 AM  GAD 7 : Generalized Anxiety Score  Nervous, Anxious, on Edge 1 0 0 1  Control/stop worrying 1 1 1 1   Worry too much - different things 0 0 1 0  Trouble relaxing 1 0 0 1  Restless 0 0 0 0  Easily annoyed or irritable 1 0 1 1  Afraid - awful might happen 1 0 1 1  Total GAD 7 Score 5 1 4 5   Anxiety Difficulty Very difficult Not difficult at all Not difficult at all Somewhat difficult   Suicidal/Homicidal: No  Therapist Response: Clinician utilized CBT, MI, supportive reflection interventions to support pt in navigating thoughts and feelings surrounding presenting stressors and sxs. Actively engaged patient in check-in, assessing moods and affect. Actively listed to pt's recounts of recent events and stressors, utilizing socratic questioning to support pt in navigating thoughts. Made attempts to support pt in processing cognitive distortions leading to negative and irrational thoughts surrounding her son being sick, the likelihood and irrational fear of herself getting sick, having stopped taking medications and eating out of fear that she will become sick and not wanting to expel medications due to fear of nausea and emesis, observable side effects of not taking medications for a week, and prolonged hesitation towards implementing boundaries within relationship with children proving to not align with expressed desires and frustrations for children to stop asking her for money every week or so. Utilized role play exercise to support pt in developing greater understanding of limits and  boundaries.  Clinician reassessed severity of presenting sxs, and presence of any safety concerns. Therapist provided support and empathy to patient during session.  Plan: Return again in 2 weeks.  Diagnosis:  Encounter Diagnosis  Name Primary?   Moderate episode of recurrent major depressive disorder (HCC) Yes    Collaboration of Care: Other none necessary at this time.  Patient/Guardian was advised Release of Information must be obtained prior to any record release in order to collaborate their care with an outside provider. Patient/Guardian was advised if they have not already done so to contact the registration department to sign all necessary forms in order for [place] to release information regarding their care.   Consent: Patient/Guardian gives verbal consent for treatment and assignment of benefits for services provided during this visit. Patient/Guardian expressed understanding and agreed to proceed.   Leisa Lenz, MSW, LCSW 07/12/2023,  10:45 AM

## 2023-07-19 ENCOUNTER — Ambulatory Visit (HOSPITAL_COMMUNITY): Payer: MEDICAID | Admitting: Licensed Clinical Social Worker

## 2023-07-26 ENCOUNTER — Encounter (HOSPITAL_COMMUNITY): Payer: Self-pay

## 2023-07-26 ENCOUNTER — Ambulatory Visit (HOSPITAL_COMMUNITY): Payer: MEDICAID | Admitting: Licensed Clinical Social Worker

## 2023-08-02 ENCOUNTER — Ambulatory Visit (INDEPENDENT_AMBULATORY_CARE_PROVIDER_SITE_OTHER): Payer: MEDICAID | Admitting: Licensed Clinical Social Worker

## 2023-08-02 DIAGNOSIS — F331 Major depressive disorder, recurrent, moderate: Secondary | ICD-10-CM | POA: Diagnosis not present

## 2023-08-02 DIAGNOSIS — F4312 Post-traumatic stress disorder, chronic: Secondary | ICD-10-CM | POA: Diagnosis not present

## 2023-08-02 NOTE — Progress Notes (Addendum)
 THERAPIST PROGRESS NOTE   Session Date: 08/02/2023  Session Time: 1005 - 1102  Virtual Visit via Video Note  I connected with Lynn [name] on 08/02/23 at 10:05 AM by a video enabled telemedicine application and verified that I am speaking with the correct person using two identifiers.  Location: Patient: Home Provider: Home Office   I discussed the limitations of evaluation and management by telemedicine and the availability of in person appointments. The patient expressed understanding and agreed to proceed.  The patient was advised to call back or seek an in-person evaluation if the symptoms worsen or if the condition fails to improve as anticipated.  I provided 56 minutes of non-face-to-face time during this encounter.  Participation Level: Active  Behavioral Response: DisheveledDrowsy and LethargicAnxious and Depressed  Type of Therapy: Individual Therapy  Treatment Goals addressed: - Lynn [name] will reduce frequency of avoidant behaviors by 50% as evidenced by self report in therapy sessions - Lynn [name] will identify cognitive patterns and beliefs that support depression  - Reduce overall depression score by a minimum of 25% on the Patient Health Questionnaire (PHQ-9)  - Increase coping skills to manage depression and improve ability to perform daily activities - Reduce frequency, intensity, and duration of depression symptoms so that daily functioning is improved  MET  MET  ProgressTowards Goals: Progressing  Interventions: CBT, Motivational Interviewing, and Supportive  Summary: Lynn [name] is a 63 year old African-American female who presents for follow-up therapy appointment in the continued management of depressive symptoms. Pt presented in pleasant moods and congruent affect throughout.   - Re-administered PHQ-9, reviewing current scores, pt's expressing hesitation to do so having had challenging weeks over past three weeks. - Pt detailed having cancelled last  visit due to "being in a bad space and felt that it would have affected where we are with things, so I used bible and turned to faith, turned to my pastor to get through challenges, as I needed spiritual help.I think that's what's helping me, suicide isn't part of my faith, it keeps me grounded and rooted, that's why I turned to my pastor, and my faith and was reading." Engaged further in exploration of relationship w/ faith and how own experiences and faith has lead pt to understand why children are unable to love and appreciate her due to pt's belief of their lacking of their own faith. - Continued exploration and processing of understanding of boundaries, expressing beliefs of inability to have boundaries when living according to her spiritual beliefs and the bible. Pt shared her plan to continue to forgive her children for their mistreatment of her as much as possible, but can not be around them or talk to them that much as they hurt pt when they do, further sharing of how hostile and hateful behaviors from children continue to occur, and return without apology asking for money.  Processed how pt's intended behaviors of distancing self from children and negative behaviors to protect herself emotionally, is a form of implementing boundaries.  Patient responded well to interventions. Patient continues to meet criteria for MDD. Patient will continue to benefit from engagement in outpatient therapy due to being the least restrictive service to meet presenting needs.      08/02/2023   10:53 AM 07/12/2023   10:40 AM 06/28/2023   10:29 AM 06/10/2023   10:38 AM 05/31/2023   10:03 AM  Depression screen PHQ 2/9  Decreased Interest 0 1 0 3 3  Down, Depressed, Hopeless 0 0 0 0 1  PHQ -  2 Score 0 1 0 3 4  Altered sleeping 0 0 0 2 2  Tired, decreased energy 0 0 0 1 3  Change in appetite 0 0 3 3 1   Feeling bad or failure about yourself  0 0 3 1 1   Trouble concentrating 0 0 0 0 0  Moving slowly or  fidgety/restless 0 0 0 0 0  Suicidal thoughts 0 0 0 0 0  PHQ-9 Score 0 1 6 10 11   Difficult doing work/chores  Somewhat difficult Not difficult at all Not difficult at all Somewhat difficult    Suicidal/Homicidal: No  Therapist Response: Clinician utilized CBT, MI, supportive reflection interventions to support pt in navigating thoughts and feelings surrounding presenting stressors and sxs.   - Actively engaged patient in check-in, assessing moods and affect. Actively listened to pt's recounts of recent events, supporting pt's reflection of recent events in processing thoughts and feelings, exploring perspectives and beliefs of whether utilizing therapy during challenging weeks would have proven of benefit to support in navigating stressors. Further listened to pt's recounts of navigating challenges through utilization of faith and consulting with pastor, eliciting thoughts and feelings regarding how this has aided pt in navigating challenges. Further challenged pt's distortions relating to challenges and/or inabilities in implementing boundaries, processing how pt proves to do so already, further evoking pt's perspectives on how pt believes this may prove to support her moving forward.  Clinician reassessed severity of presenting sxs, and presence of any safety concerns. Therapist provided support and empathy to patient during session.  Plan: Return again in 2 weeks.  Diagnosis:  Encounter Diagnoses  Name Primary?   Moderate episode of recurrent major depressive disorder (HCC) Yes   Chronic post-traumatic stress disorder (PTSD)      Collaboration of Care: Other none necessary at this time.  Patient/Guardian was advised Release of Information must be obtained prior to any record release in order to collaborate their care with an outside provider. Patient/Guardian was advised if they have not already done so to contact the registration department to sign all necessary forms in order for [place]  to release information regarding their care.   Consent: Patient/Guardian gives verbal consent for treatment and assignment of benefits for services provided during this visit. Patient/Guardian expressed understanding and agreed to proceed.   Leisa Lenz, MSW, LCSW 08/02/2023,  10:56 AM

## 2023-08-16 ENCOUNTER — Ambulatory Visit (INDEPENDENT_AMBULATORY_CARE_PROVIDER_SITE_OTHER): Payer: MEDICAID | Admitting: Licensed Clinical Social Worker

## 2023-08-16 DIAGNOSIS — F331 Major depressive disorder, recurrent, moderate: Secondary | ICD-10-CM | POA: Diagnosis not present

## 2023-08-16 NOTE — Progress Notes (Signed)
 THERAPIST PROGRESS NOTE   Session Date: 08/16/2023  Session Time: 1010 - 1057  Virtual Visit via Video Note  I connected with Lynn [name] on 08/16/23 at 10:10 AM by a video enabled telemedicine application and verified that I am speaking with the correct person using two identifiers.  Location: Patient: Home Provider: [facility]   I discussed the limitations of evaluation and management by telemedicine and the availability of in person appointments. The patient expressed understanding and agreed to proceed.  The patient was advised to call back or seek an in-person evaluation if the symptoms worsen or if the condition fails to improve as anticipated.  I provided 47 minutes of non-face-to-face time during this encounter.  Participation Level: Active  Behavioral Response: CasualAlertDepressed and Euthymic  Type of Therapy: Individual Therapy  Treatment Goals addressed: - Lynn [name] will reduce frequency of avoidant behaviors by 50% as evidenced by self report in therapy sessions - Lynn [name] will identify cognitive patterns and beliefs that support depression  - Reduce overall depression score by a minimum of 25% on the Patient Health Questionnaire (PHQ-9)  - Increase coping skills to manage depression and improve ability to perform daily activities - Reduce frequency, intensity, and duration of depression symptoms so that daily functioning is improved  ProgressTowards Goals: Progressing  Interventions: CBT, Motivational Interviewing, and Supportive  Summary: Lynn [name] is a 63 year old African-American female who presents for follow-up therapy appointment in the continued management of depressive symptoms. Pt presented initially in depressed moods, and congruent affect, proving to improve throughout duration of visit.   -Patient actively engaged in introductory check-in, reporting of doing well.  Patient openly engaged in reassessment of presenting depressive and anxious  symptoms experienced over the past 2 weeks via PHQ-9 and GAD-7, further discussing noted increase in depressive score, and further processing observed symptoms.  Patient expressed "I think I'm doing pretty good, keeping up with the house, I'm not getting out to go anywhere to do anything cause son has my car so I'm in the house cleaning all the time finding things to do."  Patient further shared of having started taking tea time in morning hours again, giving time to sit and think, processing thoughts. Pt further reported of concerns related to appetite, sharing of eating less, only eating a little each day, but not losing weight, further sharing of having canceled appt with nutritionist due to not wanting to leave the house and meet with provider in-person. Pt actively engaged with clinician in processing detailed surrounding the correlation between gut health and mental health, relationships between various foods and depressive sxs, and pt's overall poor nutrition. Pt proved receptive to reconnecting with PCP and/or nutritionist to explore possible additional supports.  Patient responded well to interventions. Patient continues to meet criteria for MDD. Patient will continue to benefit from engagement in outpatient therapy due to being the least restrictive service to meet presenting needs.      08/16/2023   10:17 AM 08/02/2023   10:53 AM 07/12/2023   10:40 AM 06/28/2023   10:29 AM 06/10/2023   10:38 AM  Depression screen PHQ 2/9  Decreased Interest 3 0 1 0 3  Down, Depressed, Hopeless 1 0 0 0 0  PHQ - 2 Score 4 0 1 0 3  Altered sleeping 1 0 0 0 2  Tired, decreased energy 1 0 0 0 1  Change in appetite 3 0 0 3 3  Feeling bad or failure about yourself  1 0 0 3 1  Trouble concentrating 0 0 0 0 0  Moving slowly or fidgety/restless  0 0 0 0  Suicidal thoughts 0 0 0 0 0  PHQ-9 Score 10 0 1 6 10   Difficult doing work/chores Somewhat difficult  Somewhat difficult Not difficult at all Not difficult at all     Suicidal/Homicidal: No  Therapist Response: Clinician utilized CBT, MI, supportive reflection interventions to support pt in navigating thoughts and feelings surrounding presenting stressors and sxs.   -Actively engage patient in introductory check-in, assessing presenting moods and affect, exploring current presentation.  Actively engage patient in reassessment of presenting depressive and anxious symptoms in the past 2 weeks via PHQ-9 and GAD-7, relating details surrounding current scores, further eliciting patient's thoughts and perspectives in relation to reported symptoms.  Actively listened to patient's recounts of recent events and challenges.  Actively engaged patient in extensive exploration of understanding of relationship between good health and mental health, and the correlation between poor diet and negative/poor moods and/or mental health symptoms.  Utilized Socratic questioning to process thoughts and perspectives further, to aid patient in navigating irrational, unrealistic thought patterns.  Clinician reassessed severity of presenting sxs, and presence of any safety concerns. Therapist provided support and empathy to patient during session.  Plan: Return again in 2 weeks.  Diagnosis:  Encounter Diagnosis  Name Primary?   Moderate episode of recurrent major depressive disorder (HCC) Yes     Collaboration of Care: Other none necessary at this time.  Patient/Guardian was advised Release of Information must be obtained prior to any record release in order to collaborate their care with an outside provider. Patient/Guardian was advised if they have not already done so to contact the registration department to sign all necessary forms in order for [place] to release information regarding their care.   Consent: Patient/Guardian gives verbal consent for treatment and assignment of benefits for services provided during this visit. Patient/Guardian expressed understanding and agreed to  proceed.   Leisa Lenz, MSW, LCSW 08/16/2023,  10:21 AM

## 2023-08-17 ENCOUNTER — Encounter (HOSPITAL_COMMUNITY): Payer: Self-pay | Admitting: Psychiatry

## 2023-08-17 ENCOUNTER — Telehealth (HOSPITAL_BASED_OUTPATIENT_CLINIC_OR_DEPARTMENT_OTHER): Payer: MEDICAID | Admitting: Psychiatry

## 2023-08-17 VITALS — Wt 230.0 lb

## 2023-08-17 DIAGNOSIS — F331 Major depressive disorder, recurrent, moderate: Secondary | ICD-10-CM | POA: Diagnosis not present

## 2023-08-17 DIAGNOSIS — F4312 Post-traumatic stress disorder, chronic: Secondary | ICD-10-CM

## 2023-08-17 MED ORDER — LURASIDONE HCL 40 MG PO TABS
40.0000 mg | ORAL_TABLET | Freq: Every day | ORAL | 2 refills | Status: AC
Start: 2023-08-17 — End: ?

## 2023-08-17 MED ORDER — FLUOXETINE HCL 20 MG PO CAPS
20.0000 mg | ORAL_CAPSULE | Freq: Every day | ORAL | 2 refills | Status: AC
Start: 2023-08-17 — End: ?
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
## 2023-08-30 ENCOUNTER — Ambulatory Visit (INDEPENDENT_AMBULATORY_CARE_PROVIDER_SITE_OTHER): Payer: MEDICAID | Admitting: Licensed Clinical Social Worker

## 2023-08-30 DIAGNOSIS — F331 Major depressive disorder, recurrent, moderate: Secondary | ICD-10-CM | POA: Diagnosis not present

## 2023-08-30 NOTE — Progress Notes (Signed)
 THERAPIST PROGRESS NOTE   Session Date: 08/30/2023  Session Time: 1405 - 1458  Virtual Visit via Video Note  I connected with [name] on 08/30/23 at 14:05 PM by a video enabled telemedicine application and verified that I am speaking with the correct person using two identifiers.  Location: Patient: Home Provider: [facility]   I discussed the limitations of evaluation and management by telemedicine and the availability of in person appointments. The patient expressed understanding and agreed to proceed.  The patient was advised to call back or seek an in-person evaluation if the symptoms worsen or if the condition fails to improve as anticipated.  I provided 53 minutes of non-face-to-face time during this encounter.  Participation Level: Active  Behavioral Response: CasualAlertEuthymic  Type of Therapy: Individual Therapy  Treatment Goals addressed: - Makahla will reduce frequency of avoidant behaviors by 50% as evidenced by self report in therapy sessions - Viveka will identify cognitive patterns and beliefs that support depression  - Reduce overall depression score by a minimum of 25% on the Patient Health Questionnaire (PHQ-9)  - Increase coping skills to manage depression and improve ability to perform daily activities - Reduce frequency, intensity, and duration of depression symptoms so that daily functioning is improved  ProgressTowards Goals: Progressing  Interventions: CBT, Motivational Interviewing, and Supportive  Summary: Danella Philson is a 63 year old African-American female who presents for follow-up therapy appointment in the continued management of depressive symptoms.   Patient actively engaged in introductory check-in, presenting in overall pleasant moods and congruent affect throughout duration of visit.  Patient briefly provided details surrounding presenting moods, expressing of doing well and things going okay, sharing belief that her son  has finally moved out of her home as he has been staying at his girlfriend's for some time, and only coming to patient's home when needing to use patient's car to get to and from work due to his currently being in the shop for the unforeseeable future.  Patient did express some stress surrounding son having to use her car almost daily, reiterating previous concerns of leaving her without a vehicle.  Engaged in reassessing presenting depressive symptoms via PHQ-9, exploring current screening score, comparison to prior scores, and processing identified symptoms experienced.  Patient expressed feeling that she is not depressed any longer, with ultimate desire to discontinue depression medications in the future.  Further processed experienced symptoms related to eating habits, noting of various contributing factors to include decreased activity level, thus resulting in less frequency of feeling hungry.  Patient briefly explored recent appointment with Dr. Lolly Mustache, MD, expressing concerns surrounding self-confidence and wearing masks regularly due to not having teeth in, further processing non-verbal cues and responses observable from facial expressions which are necessary to all providers ongoing assessments.  Patient proved understanding of details reviewed in session.  Patient responded well to interventions. Patient continues to meet criteria for MDD. Patient will continue to benefit from engagement in outpatient therapy due to being the least restrictive service to meet presenting needs.      08/30/2023    2:15 PM 08/16/2023   10:17 AM 08/02/2023   10:53 AM 07/12/2023   10:40 AM 06/28/2023   10:29 AM  Depression screen PHQ 2/9  Decreased Interest 1 3 0 1 0  Down, Depressed, Hopeless 1 1 0 0 0  PHQ - 2 Score 2 4 0 1 0  Altered sleeping 1 1 0 0 0  Tired, decreased energy 1 1 0 0 0  Change in appetite 2 3 0 0 3  Feeling bad or failure about yourself  0 1 0 0 3  Trouble concentrating 0 0 0 0 0  Moving  slowly or fidgety/restless 0  0 0 0  Suicidal thoughts 0 0 0 0 0  PHQ-9 Score 6 10 0 1 6  Difficult doing work/chores Not difficult at all Somewhat difficult  Somewhat difficult Not difficult at all    Suicidal/Homicidal: No  Therapist Response: Clinician utilized CBT, MI, supportive reflection interventions to support pt in navigating thoughts and feelings surrounding presenting stressors and sxs.   -Actively engage patient in introductory check-in, assessing presenting moods and affect, prompting patient brief reflection of presenting moods and any present stressors.  Utilized open-ended questions and efforts to further elicit patient recounts of recent events, actively listening to patient reports of events, and reports of reduction of symptoms, and increased daily functioning and completing of tasks around home since son has left.  Engage patient in review of presenting depressive symptoms, administering PHQ-9, and engaging in further exploration of variances in scores across recent months.  Utilized Socratic questioning to further patient's critical understanding and exploration of expressed thoughts related to depressive symptoms.  Supported patient in processing behavioral activation techniques to further improve management of depressive symptoms.  Provided psycho Ed around the needs of providers to observe facial expressions for ongoing assessment and the barriers of which masking while meeting virtually imposes.  Clinician reassessed severity of presenting sxs, and presence of any safety concerns. Therapist provided support and empathy to patient during session.  Plan: Return again in 2 weeks.  Diagnosis:  Encounter Diagnosis  Name Primary?   Moderate episode of recurrent major depressive disorder (HCC) Yes     Collaboration of Care: Other none necessary at this time.  Patient/Guardian was advised Release of Information must be obtained prior to any record release in order to  collaborate their care with an outside provider. Patient/Guardian was advised if they have not already done so to contact the registration department to sign all necessary forms in order for [place] to release information regarding their care.   Consent: Patient/Guardian gives verbal consent for treatment and assignment of benefits for services provided during this visit. Patient/Guardian expressed understanding and agreed to proceed.   Leisa Lenz, MSW, LCSW 08/30/2023,  2:59 PM

## 2023-09-01 ENCOUNTER — Ambulatory Visit: Payer: MEDICAID | Admitting: Podiatry

## 2023-09-13 ENCOUNTER — Ambulatory Visit (HOSPITAL_COMMUNITY): Payer: MEDICAID | Admitting: Licensed Clinical Social Worker

## 2023-09-13 DIAGNOSIS — F331 Major depressive disorder, recurrent, moderate: Secondary | ICD-10-CM | POA: Diagnosis not present

## 2023-09-13 NOTE — Progress Notes (Signed)
 THERAPIST PROGRESS NOTE   Session Date: 09/13/2023  Session Time: 1005 - 1058  Virtual Visit via Video Note  I connected with [name] on 09/13/23 at 10:05 AM by a video enabled telemedicine application and verified that I am speaking with the correct person using two identifiers.  Location: Patient: Cousin's residence in [place] Provider: Home Office   I discussed the limitations of evaluation and management by telemedicine and the availability of in person appointments. The patient expressed understanding and agreed to proceed.  The patient was advised to call back or seek an in-person evaluation if the symptoms worsen or if the condition fails to improve as anticipated.  I provided 52 minutes of non-face-to-face time during this encounter.  Participation Level: Active  Behavioral Response: CasualAlertAnxious, Euthymic, and tearful  Type of Therapy: Individual Therapy  Treatment Goals addressed: - Verlyn will reduce frequency of avoidant behaviors by 50% as evidenced by self report in therapy sessions - Quinlan will identify cognitive patterns and beliefs that support depression  - Reduce overall depression score by a minimum of 25% on the Patient Health Questionnaire (PHQ-9)  - Increase coping skills to manage depression and improve ability to perform daily activities - Reduce frequency, intensity, and duration of depression symptoms so that daily functioning is improved  ProgressTowards Goals: Progressing  Interventions: CBT, Motivational Interviewing, and Supportive  Summary: Ilyanna Baillargeon is a 63 year old African-American female who presents for follow-up therapy appointment in the continued management of depressive symptoms.   Pt actively engaged in session, presenting in overall pleasant moods with congruent affect throughout session, with periods of increased anxiousness and accompanying tearfulness when processing recent stressors and pt's uncertainty  surrounding how to proceed with managing stress. Pt actively engaged in introductory check-in, sharing of "making it", when prompted to reflect on presenting moods. Pt actively engaged in reflection of recent presenting stressors, reporting of experiencing a recent attempted break-in at her home last Monday night, having noticed screen door to home having been attempted to be removed on Tuesday a.m. when opening door to leave. Pt actively shared of increased stress and anxiety resulting in inabilities to sleep Tuesday night, having stayed up with lights on all throughout the house, due to feeling fearful of potential attempted return break-in, leading pt to contact cousin on Wednesday to ask to stay at cousin's in [place], acknowledging of inabilities to sleep Wednesday night and finishing cleaning up house and gathering belongings to leave early Thursday morning. Pt endorsed fear to go outside to take out trash and place belongings in car in preparation to leave, out of fear of someone attacking her when doing so. Pt reports that she feels okay at cousin's, being able to sleep better since Thursday when arriving at cousins, proving able to go out, and went to [facility] yesterday. Pt acknowledged stress surrounding inabilities to remain at cousins permanently and expressed fear to return to home, sharing of considering returning home today but expressing fear of someone breaking in and harming her. Actively processed options of moving to alternate home within [facility] community, alternate options to help pt feel safe, expressed someone living with her however noting this not being an option, and not believing she will feel safe without someone being with her. Pt acknowledged options to stay with daughter and cousin for brief periods of time until feeling comfortable in returning to home by herself.  Patient responded well to interventions. Patient continues to meet criteria for MDD. Patient will continue to  benefit from  engagement in outpatient therapy due to being the least restrictive service to meet presenting needs.      08/30/2023    2:15 PM 08/16/2023   10:17 AM 08/02/2023   10:53 AM 07/12/2023   10:40 AM 06/28/2023   10:29 AM  Depression screen PHQ 2/9  Decreased Interest 1 3 0 1 0  Down, Depressed, Hopeless 1 1 0 0 0  PHQ - 2 Score 2 4 0 1 0  Altered sleeping 1 1 0 0 0  Tired, decreased energy 1 1 0 0 0  Change in appetite 2 3 0 0 3  Feeling bad or failure about yourself  0 1 0 0 3  Trouble concentrating 0 0 0 0 0  Moving slowly or fidgety/restless 0  0 0 0  Suicidal thoughts 0 0 0 0 0  PHQ-9 Score 6 10 0 1 6  Difficult doing work/chores Not difficult at all Somewhat difficult  Somewhat difficult Not difficult at all    Suicidal/Homicidal: No  Therapist Response: Clinician utilized CBT, MI, supportive reflection interventions to support pt in navigating thoughts and feelings surrounding presenting stressors and sxs.   -Clinician actively greeted patient, engaging in introductory check-in, assessing presenting moods and affect and encouraging patient's reflection and identification of recent challenges and/or stressors contributing to presenting moods.  Actively listening to patient's reflections of recent stress, providing support and acknowledging increased stressors, and validation of expressed thoughts and feelings in relation to presenting challenge.  Utilized open-ended questions to further elicit patient's recounts of recent events, actively listening to reflections of events, and supporting patient in processing impact of recent events and observed symptoms.  Patient utilized MI and psychoed interventions to support patient in processing events, responses to events, and possible means of navigating expressed concerns.  Utilized Socratic questioning to further encourage critical processing of expressed thoughts surrounding presenting challenges, validating expressed  feelings.  Clinician reassessed severity of presenting sxs, and presence of any safety concerns. Therapist provided support and empathy to patient during session.  Plan: Return again in 2 weeks.  Diagnosis:  Encounter Diagnosis  Name Primary?   Moderate episode of recurrent major depressive disorder (HCC) Yes   Collaboration of Care: Other none necessary at this time.  Patient/Guardian was advised Release of Information must be obtained prior to any record release in order to collaborate their care with an outside provider. Patient/Guardian was advised if they have not already done so to contact the registration department to sign all necessary forms in order for us  to release information regarding their care.   Consent: Patient/Guardian gives verbal consent for treatment and assignment of benefits for services provided during this visit. Patient/Guardian expressed understanding and agreed to proceed.   Patsi Boots, MSW, LCSW 09/13/2023,  10:07 AM

## 2023-09-27 ENCOUNTER — Ambulatory Visit (HOSPITAL_COMMUNITY): Payer: MEDICAID | Admitting: Licensed Clinical Social Worker

## 2023-09-28 ENCOUNTER — Encounter (HOSPITAL_COMMUNITY): Payer: Self-pay

## 2023-09-28 ENCOUNTER — Ambulatory Visit (INDEPENDENT_AMBULATORY_CARE_PROVIDER_SITE_OTHER): Payer: MEDICAID | Admitting: Licensed Clinical Social Worker

## 2023-09-28 DIAGNOSIS — F331 Major depressive disorder, recurrent, moderate: Secondary | ICD-10-CM

## 2023-09-28 NOTE — Progress Notes (Unsigned)
 THERAPIST PROGRESS NOTE   Session Date: 09/28/2023  Session Time: 1408 - 1508  Virtual Visit via Video Note  I connected with Lynn [name] on 09/28/23 at  2:00 PM EDT by a video enabled telemedicine application and verified that I am speaking with the correct person using two identifiers.  Location: Patient: Home Provider: Home Office   I discussed the limitations of evaluation and management by telemedicine and the availability of in person appointments. The patient expressed understanding and agreed to proceed.  The patient was advised to call back or seek an in-person evaluation if the symptoms worsen or if the condition fails to improve as anticipated.  I provided 60 minutes of non-face-to-face time during this encounter.  Participation Level: Active  Behavioral Response: CasualAlertEuthymic  Type of Therapy: Individual Therapy  Treatment Goals addressed: - Rannah will reduce frequency of avoidant behaviors by 50% as evidenced by self report in therapy sessions (Discontinued) - Heater will identify cognitive patterns and beliefs that support depression  - Reduce overall depression score by a minimum of 25% on the Patient Health Questionnaire (PHQ-9) (MET) - Increase coping skills to manage depression and improve ability to perform daily activities - Reduce frequency, intensity, and duration of depression symptoms so that daily functioning is improved (MET)  ProgressTowards Goals: Progressing  Interventions: CBT, Motivational Interviewing, and Supportive  Summary: Lynn [name] is a 63 year old African-American female who presents for follow-up therapy appointment in the continued management of depressive symptoms.   Patient actively engaged in session, presenting in pleasant moods and congruent affect throughout.  Patient actively engaged in introductory check-in, providing extensive reflections of recent events since previous session and factors contributing to  presenting moods.  Patient detailed having returned home on date of last visit w/ provider in attempts to navigate recent break in stressor, further noting of son having returned to home to stay with pt, and now finding self feeling safer at night, however expressing challenges and dislike for being confined to room with door shut when son is home and playing the game or watching things on TV.  Patient expressed feeling able to sleep having son at the home and feels safe with him there, but finds self isolating in room at night which pt feels isn't healthy.  Patient detailed recent nephrology visit yesterday, reporting of positive outcomes with reduction of A1C and reduction in frequency of which patient must visit.  Further engaged in reflection of therapy services throughout the past 6 months and efforts to review/revise individualized treatment plan, discontinuing goal related to avoidant behavior due to patient's level of comfort with not engaging in social interactions at current age, and noting of areas for continued work surrounding cognitive patterns and identification of greater coping techniques.  Patient responded well to interventions. Patient continues to meet criteria for MDD. Patient will continue to benefit from engagement in outpatient therapy due to being the least restrictive service to meet presenting needs.      09/28/2023    2:36 PM 08/30/2023    2:15 PM 08/16/2023   10:17 AM 08/02/2023   10:53 AM 07/12/2023   10:40 AM  Depression screen PHQ 2/9  Decreased Interest 0 1 3 0 1  Down, Depressed, Hopeless 0 1 1 0 0  PHQ - 2 Score 0 2 4 0 1  Altered sleeping 1 1 1  0 0  Tired, decreased energy 0 1 1 0 0  Change in appetite 0 2 3 0 0  Feeling bad or failure about yourself  0 0 1 0 0  Trouble concentrating 0 0 0 0 0  Moving slowly or fidgety/restless 0 0  0 0  Suicidal thoughts 0 0 0 0 0  PHQ-9 Score 1 6 10  0 1  Difficult doing work/chores Not difficult at all Not difficult at all  Somewhat difficult  Somewhat difficult    Suicidal/Homicidal: No  Therapist Response: Clinician utilized CBT, MI, supportive reflection interventions to support pt in navigating thoughts and feelings surrounding presenting stressors and sxs.   Clinician actively greeted patient upon joining virtual visit, engaging in introductory check-in, assessing presenting moods and affect and further eliciting patient's recounts of recent events since previous visit proving to impact presentation.  Clinician actively listened to the patient's reflections of recent events and efforts at navigating prior identified stressors, utilizing open-ended questions to further encourage patient's reflection of thoughts and feelings related to events and efforts at managing.  Utilized Socratic questioning to further evoke greater critical thinking surrounding patient's expressed thoughts and feelings, supporting patient in challenging irrational thoughts.  Further engaged patient in assessing progressions throughout treatment thus far, reflecting on each individualized treatment goal, noted progress, and continued areas for work.  Clinician reassessed severity of presenting sxs, and presence of any safety concerns. Therapist provided support and empathy to patient during session.  Plan: Return again in 2 weeks.  Diagnosis:  Encounter Diagnosis  Name Primary?   Moderate episode of recurrent major depressive disorder (HCC) Yes    Collaboration of Care: Other none necessary at this time.  Patient/Guardian was advised Release of Information must be obtained prior to any record release in order to collaborate their care with an outside provider. Patient/Guardian was advised if they have not already done so to contact the registration department to sign all necessary forms in order for us  to release information regarding their care.   Consent: Patient/Guardian gives verbal consent for treatment and assignment of benefits  for services provided during this visit. Patient/Guardian expressed understanding and agreed to proceed.   Patsi Boots, MSW, LCSW 09/28/2023,  2:58 PM

## 2023-10-05 ENCOUNTER — Ambulatory Visit
Admission: RE | Admit: 2023-10-05 | Discharge: 2023-10-05 | Disposition: A | Discharge status: Home / Self Care | Payer: MEDICAID | Source: Ambulatory Visit | Attending: Family Medicine | Admitting: Family Medicine

## 2023-10-05 DIAGNOSIS — Z1231 Encounter for screening mammogram for malignant neoplasm of breast: Secondary | ICD-10-CM | POA: Insufficient documentation

## 2023-10-06 ENCOUNTER — Inpatient Hospital Stay
Admission: RE | Admit: 2023-10-06 | Discharge: 2023-10-06 | Disposition: A | Discharge status: Home / Self Care | Payer: Self-pay | Source: Ambulatory Visit | Attending: Family Medicine | Admitting: Family Medicine

## 2023-10-06 ENCOUNTER — Other Ambulatory Visit: Payer: Self-pay | Admitting: *Deleted

## 2023-10-06 DIAGNOSIS — Z1231 Encounter for screening mammogram for malignant neoplasm of breast: Secondary | ICD-10-CM

## 2023-10-11 ENCOUNTER — Ambulatory Visit (INDEPENDENT_AMBULATORY_CARE_PROVIDER_SITE_OTHER): Payer: MEDICAID | Admitting: Licensed Clinical Social Worker

## 2023-10-11 DIAGNOSIS — F331 Major depressive disorder, recurrent, moderate: Secondary | ICD-10-CM | POA: Diagnosis not present

## 2023-10-11 NOTE — Progress Notes (Signed)
 THERAPIST PROGRESS NOTE   Session Date: 10/11/2023  Session Time: 1305 - 1354  Virtual Visit via Video Note  I connected with Lynn [name] on 10/11/23 at 1:05 PM EDT by a video enabled telemedicine application and verified that I am speaking with the correct person using two identifiers.  Location: Patient: Home Provider: Home Office   I discussed the limitations of evaluation and management by telemedicine and the availability of in person appointments. The patient expressed understanding and agreed to proceed.  The patient was advised to call back or seek an in-person evaluation if the symptoms worsen or if the condition fails to improve as anticipated.  I provided 49 minutes of non-face-to-face time during this encounter.  Participation Level: Active  Behavioral Response: CasualAlertDepressed  Type of Therapy: Individual Therapy  Treatment Goals addressed: - Lynn [name] will identify cognitive patterns and beliefs that support depression  - Increase coping skills to manage depression and improve ability to perform daily activities   ProgressTowards Goals: Progressing  Interventions: CBT, Motivational Interviewing, and Supportive  Summary: Lynn [name] is a 63 year old African-American female who presents for follow-up therapy appointment in the continued management of depressive symptoms.   Patient actively engaged in session, presenting in depressed moods and congruent, flat affect throughout. Patient engaged in introductory check-in, appearing increasingly depressed, having not groomed hair or put in teeth, resulting in wearing mask. Pt expressed not feeling like putting teeth in today, further detailing increased stress surrounding son having moved back into home, proving to not clean up after himself and needing to use pt's car regularly due to no longer having transportation to and from work, further expressing of being "tired, just tired. I can't deal with these kids  stuff anymore", further detailing of added stress surrounding having gotten a new phone and feeling of being misinformed and taken advantage of by the phone company and convinced to agree to purchasing a tablet, and finding self concerned surrounding the added charges to bill. Pt detailed as to how she proved to address concerns via contacting customer support and returning the device to the store. Pt further shared of feeling like "I'm losing it, because this kind of stuff I don't do, but I'm doing it, don't tell me I'm losing it, but I know I am", further sharing of challenges surrounding setting up phone with new AI features, finding self getting drawn into communicating with AI feature. Shared concerns of feeling creeped out by AI feature. Pt proved receptive to clinician's support in clarification of AI features and being added support to devices versus "losing it". Shared of feeling better after having processed concerns. Shared of feeling need to get sleep patterns improved with finding self crying all the time over children's needs and recent issues with phone. Further processed stress surrounding adult children and their continued needs and reliance on pt.  Patient responded well to interventions. Patient continues to meet criteria for MDD. Patient will continue to benefit from engagement in outpatient therapy due to being the least restrictive service to meet presenting needs.      09/28/2023    2:36 PM 08/30/2023    2:15 PM 08/16/2023   10:17 AM 08/02/2023   10:53 AM 07/12/2023   10:40 AM  Depression screen PHQ 2/9  Decreased Interest 0 1 3 0 1  Down, Depressed, Hopeless 0 1 1 0 0  PHQ - 2 Score 0 2 4 0 1  Altered sleeping 1 1 1  0 0  Tired, decreased energy 0 1 1  0 0  Change in appetite 0 2 3 0 0  Feeling bad or failure about yourself  0 0 1 0 0  Trouble concentrating 0 0 0 0 0  Moving slowly or fidgety/restless 0 0  0 0  Suicidal thoughts 0 0 0 0 0  PHQ-9 Score 1 6 10  0 1  Difficult  doing work/chores Not difficult at all Not difficult at all Somewhat difficult  Somewhat difficult    Suicidal/Homicidal: No  Therapist Response: Clinician utilized CBT, MI, supportive reflection interventions to support pt in navigating thoughts and feelings surrounding presenting stressors and sxs.   Clinician actively greeted patient upon joining virtual visit, engaging in introductory check-in, assessing presenting moods and affect and eliciting recounts of events contributing to presenting moods. Clinician actively listened to patient's recounts of recent events and presenting stressors proving to increase depressive symptoms over recent weeks, validating patient's expressed thoughts and feelings, providing support to patient and processing stressors.  Further supported patient in clarifying understanding of thoughts and concerns surrounding new AI features on phone and patient's experienced fear and uncertainty surrounding technology.  Utilized Socratic questioning to further support patient's critical processing of challenging relationships with adult children, prompting patient to explore efforts at managing presenting stressors.  Clinician reassessed severity of presenting sxs, and presence of any safety concerns. Therapist provided support and empathy to patient during session.  Plan: Return again in 2 weeks.  Diagnosis:  Encounter Diagnosis  Name Primary?   Moderate episode of recurrent major depressive disorder (HCC) Yes    Collaboration of Care: Other none necessary at this time.  Patient/Guardian was advised Release of Information must be obtained prior to any record release in order to collaborate their care with an outside provider. Patient/Guardian was advised if they have not already done so to contact the registration department to sign all necessary forms in order for us  to release information regarding their care.   Consent: Patient/Guardian gives verbal consent for  treatment and assignment of benefits for services provided during this visit. Patient/Guardian expressed understanding and agreed to proceed.   Patsi Boots, MSW, LCSW 10/11/2023,  1:07 PM

## 2023-10-29 ENCOUNTER — Ambulatory Visit (HOSPITAL_COMMUNITY): Payer: MEDICAID | Admitting: Licensed Clinical Social Worker

## 2023-11-04 ENCOUNTER — Telehealth (HOSPITAL_COMMUNITY): Payer: Self-pay

## 2023-11-04 ENCOUNTER — Ambulatory Visit (INDEPENDENT_AMBULATORY_CARE_PROVIDER_SITE_OTHER): Payer: MEDICAID | Admitting: Licensed Clinical Social Worker

## 2023-11-04 DIAGNOSIS — F331 Major depressive disorder, recurrent, moderate: Secondary | ICD-10-CM

## 2023-11-04 NOTE — Telephone Encounter (Signed)
 Unfortunately if patient will not take the medicine she will decompensate.  We can try injectables but there are barriers including high blood sugar, insurance and patient resistance.  I will talk to her on her next appointment with the options.

## 2023-11-04 NOTE — Telephone Encounter (Signed)
 I had a call with the pharmacist at [facility], pharmacy. She said that patient believes someone is breaking into her house at night when she is asleep and changing her medication. Patient has not had her medications in 3 weeks. The pharmacist said that she called and spoke with the patient and they confirmed that the pills that she has are the correct pills. She told the pharmacist that she will start taking them again. I called the patient to check on her and got the voicemail, I left a HIPAA compliant message. I will check in with patient tomorrow

## 2023-11-04 NOTE — Progress Notes (Signed)
 THERAPIST PROGRESS NOTE   Session Date: 11/04/2023  Session Time: 1409 - 1459  Virtual Visit via Video Note  I connected with Lynn [name] on 11/04/23 at 2:09 PM EDT by a video enabled telemedicine application and verified that I am speaking with the correct person using two identifiers.  Location: Patient: Home Provider: [facility] [place] [facility]   I discussed the limitations of evaluation and management by telemedicine and the availability of in person appointments. The patient expressed understanding and agreed to proceed.  The patient was advised to call back or seek an in-person evaluation if the symptoms worsen or if the condition fails to improve as anticipated.  I provided 50 minutes of non-face-to-face time during this encounter.  Participation Level: Active  Behavioral Response: GuardedConfusedAnxious, Depressed, and paranoid, and tearfulness  Type of Therapy: Individual Therapy  Treatment Goals addressed: - [name] will identify cognitive patterns and beliefs that support depression  - Increase coping skills to manage depression and improve ability to perform daily activities  ProgressTowards Goals: Not Progressing  Interventions: CBT, Motivational Interviewing, and Supportive  Summary: Lynn [name] is a 63 year old African-American female who presents for follow-up therapy appointment in the continued management of depressive symptoms.   Patient engaged in session, presenting in depressed and anxious moods, and congruent affect throughout. Pt engaged in introductory check-in, sharing of "it is not going", engaging in further details of recent events.  Pt appears increasingly distressed, paranoid, and tearful, expressing beliefs of "not having medications", further reporting of believing that someone, an unknown man, is breaking into her home, stealing her medications and replacing them with others at night. Pt reports hearing unknown man in home at night who is hiding inside  home when pt searches, proving unable to see him, further sharing of this unknown voice having told pt "I told you you don't have long to live", pt reports having responded to voice and told voice that "he doesn't decide that". Pt expressed unfamiliarity with medications received from pharmacy, resulting in having refused to take any medications over recent weeks. Shared of having not informed anyone other than property manager of perceived events, not providing details to son whom had previously been residing at pt's home, nor having shared details with daughter when having connected recently. Pt repeatedly expressed of being "okay" and only needing her medications and not needing to go to hospital for evaluation.  Patient denied any concerns surrounding being a risk to self.  Patient proved unconvinced by providers efforts at reassuring her of her medications being accurate, believing that 2 of the medications have been switched due to her unfamiliarity with them.  Patient did not proved receptive to providers confirmation and having provided further visual confirmation through sharing screen of medication search for fluoxetine  capsule and lisinopril tablet that proved to be patient's increased concerns.  Patient proved receptive to provide his encouragement to contact pharmacy to confirm provided medications and clarify any potential manufacturer change, as advised by clinician and Arfeen, MD.     09/28/2023    2:36 PM 08/30/2023    2:15 PM 08/16/2023   10:17 AM 08/02/2023   10:53 AM 07/12/2023   10:40 AM  Depression screen PHQ 2/9  Decreased Interest 0 1 3 0 1  Down, Depressed, Hopeless 0 1 1 0 0  PHQ - 2 Score 0 2 4 0 1  Altered sleeping 1 1 1  0 0  Tired, decreased energy 0 1 1 0 0  Change in appetite 0 2 3 0  0  Feeling bad or failure about yourself  0 0 1 0 0  Trouble concentrating 0 0 0 0 0  Moving slowly or fidgety/restless 0 0  0 0  Suicidal thoughts 0 0 0 0 0  PHQ-9 Score 1 6 10  0 1   Difficult doing work/chores Not difficult at all Not difficult at all Somewhat difficult  Somewhat difficult    Suicidal/Homicidal: No  Therapist Response: Clinician utilized CBT, MI, supportive reflection interventions to support pt in navigating thoughts and feelings surrounding presenting stressors and sxs.   Clinician actively greeted patient upon joining virtual visit, engaging in introductory check-in, assessing presenting moods and affect, and further prompting patient's reflections of events and factors contributing to presenting moods.  Actively listened to patient's recounts of recent events, factors contributing to presenting moods, utilizing open-ended questions to further elicit greater detail surrounding events and factors contributing to presenting moods, hallucinations, and decompensated state.  Provided support and empathy for patient's expressed thoughts, feelings and experiences, further supporting patient through provision of psychoeducation, CBT, and MI to challenge irrational thoughts, support patient via provision of fact, and encouraged to addicted to medication regimen to alleviate symptoms.  Consulted with MD via secure check regarding patient's presentation, further encouraging patient to contact pharmacy to confirm medication and/or clarify potential change in manufacturer.  Clinician reassessed severity of presenting sxs, and presence of any safety concerns. Therapist provided support and empathy to patient during session.  Plan: Return again in 2 weeks.  Diagnosis:  Encounter Diagnosis  Name Primary?   Moderate episode of recurrent major depressive disorder (HCC) Yes     Collaboration of Care: Psychiatrist AEB provider documentation available in EHR. Consulted with Arfeen,MD, confirming recommendation to consult with pharmacy re filled prescriptions.   Patient/Guardian was advised Release of Information must be obtained prior to any record release in order to  collaborate their care with an outside provider. Patient/Guardian was advised if they have not already done so to contact the registration department to sign all necessary forms in order for us  to release information regarding their care.   Consent: Patient/Guardian gives verbal consent for treatment and assignment of benefits for services provided during this visit. Patient/Guardian expressed understanding and agreed to proceed.   Patsi Boots, MSW, LCSW 11/04/2023,  2:40 PM

## 2023-11-05 NOTE — Telephone Encounter (Signed)
 I called the patient this morning and she said she is taking her medication, I will call her again on Monday

## 2023-11-09 ENCOUNTER — Ambulatory Visit (INDEPENDENT_AMBULATORY_CARE_PROVIDER_SITE_OTHER): Payer: MEDICAID | Admitting: Licensed Clinical Social Worker

## 2023-11-09 ENCOUNTER — Ambulatory Visit (HOSPITAL_COMMUNITY): Payer: MEDICAID | Admitting: Licensed Clinical Social Worker

## 2023-11-09 DIAGNOSIS — F331 Major depressive disorder, recurrent, moderate: Secondary | ICD-10-CM | POA: Diagnosis not present

## 2023-11-09 NOTE — Telephone Encounter (Signed)
 I called patient today and she sounded much better, she agreed to see Royston Cornea this afternoon at 2 pm and she has a follow up with Dr. Arfeen next week.

## 2023-11-09 NOTE — Progress Notes (Unsigned)
 THERAPIST PROGRESS NOTE   Session Date: 11/09/2023  Session Time: 1409 - 1526  Virtual Visit via Video Note  I connected with Lynn [name] on 11/09/23 at 2:09 PM EDT by a video enabled telemedicine application and verified that I am speaking with the correct person using two identifiers.  Location: Patient: Home Provider: [facility] [place] [facility]   I discussed the limitations of evaluation and management by telemedicine and the availability of in person appointments. The patient expressed understanding and agreed to proceed.  The patient was advised to call back or seek an in-person evaluation if the symptoms worsen or if the condition fails to improve as anticipated.  I provided 77 minutes of non-face-to-face time during this encounter.  Participation Level: Active  Behavioral Response: CasualAlertAnxious and Euthymic  Type of Therapy: Individual Therapy  Treatment Goals addressed: - Wealthy will identify cognitive patterns and beliefs that support depression  - Increase coping skills to manage depression and improve ability to perform daily activities  ProgressTowards Goals: Progressing  Interventions: CBT, Motivational Interviewing, and Supportive  Summary: Lynn [name] is a 63 year old African-American female who presents for follow-up therapy appointment in the continued management of depressive symptoms.  Patient engaged in session, presenting in depressed and anxious moods, and congruent affect throughout. Pt engaged in introductory check-in, sharing of being tired, detailing of not getting much sleep last night, sharing of not feeling like self, feeling increasingly tired. Further detailed of having had to throw out all food, sharing of not thinking it was any good anymore, not trusting the food, reporting of still having the belief that someone was coming into home. Reviewed presentation of last week related to medication non-compliance, confirming of having consulted with  pharmacy of medication concerns leading to pt having stopped taking medications. Pt further shared I feel okay, I feel good, I'm just tired. My head feels better, I wasn't feeling well, my head was cloudy or something, I didn't feel good. Denies hearing voice previously reported in last weeks session, endorsing still hearing moving around at night, but not as much. Explored recent interactions with son, sharing of son having reported being in the home when the property maintenance changed the locks, further speaking to suspicions of her children being that pt may be experiencing early stages of dementia. Explored relationships with children, identifying which children she identifies as supportive, noting of not finding either of children to be supportive, detailing youngest son depends on her, and daughter's relationship is regularly conflictual. Explored possibility of switching to alternate providers in area closer to residence in order to see providers in-person, declining interest. Extensively engaged in reflection of pt's hx of discontinuing medications and the decompensated states of which pt present afterwards, reflecting on past month and increased sxs, as well as instance occurring in 07/2023 when pt experienced similar decompensated state. Processed interest in exploring LAI options at next med man visit.     09/28/2023    2:36 PM 08/30/2023    2:15 PM 08/16/2023   10:17 AM 08/02/2023   10:53 AM 07/12/2023   10:40 AM  Depression screen PHQ 2/9  Decreased Interest 0 1 3 0 1  Down, Depressed, Hopeless 0 1 1 0 0  PHQ - 2 Score 0 2 4 0 1  Altered sleeping 1 1 1  0 0  Tired, decreased energy 0 1 1 0 0  Change in appetite 0 2 3 0 0  Feeling bad or failure about yourself  0 0 1 0 0  Trouble  concentrating 0 0 0 0 0  Moving slowly or fidgety/restless 0 0  0 0  Suicidal thoughts 0 0 0 0 0  PHQ-9 Score 1 6 10  0 1  Difficult doing work/chores Not difficult at all Not difficult at all Somewhat difficult   Somewhat difficult    Suicidal/Homicidal: No  Therapist Response: Clinician utilized CBT, MI, supportive reflection interventions to support pt in navigating thoughts and feelings surrounding presenting stressors and sxs.   Clinician actively greeted patient upon joining virtual visit, engaging in introductory check-in, assessing presenting moods and affect, prompting patient's engagement in recounts of past week since previous visit, actively listening to patient's recounts of events of the past week and significant improvement in presentation, moods, minimal distress.  Utilized open-ended questions to support patient in processing the events of the past week, exploring patient's observed improvements in symptoms since resuming medication regimen, providing support, empathy, and validation of the patient's experience of distress over recent weeks.  Actively assist patient's experienced symptoms, providing psychoeducation surrounding fatigue experienced with resuming medication, and mild continued symptoms related to altered perception.  Utilized MI and supportive reflection interventions to aid patient in processing thoughts and feelings surrounding recent events and continued stressors, providing psychoeducation in relation to decompensation and increased distress surrounding habits of discontinuing medication, providing support and exploration of possible benefits of long-acting injectable options to be explored at upcoming med management visit.  Clinician reassessed severity of presenting sxs, and presence of any safety concerns. Therapist provided support and empathy to patient during session.  Plan: Return again in 2 weeks.  Diagnosis:  Encounter Diagnosis  Name Primary?   Moderate episode of recurrent major depressive disorder (HCC) Yes   Collaboration of Care: Psychiatrist AEB provider documentation available in EHR.  Patient/Guardian was advised Release of Information must be obtained  prior to any record release in order to collaborate their care with an outside provider. Patient/Guardian was advised if they have not already done so to contact the registration department to sign all necessary forms in order for us  to release information regarding their care.   Consent: Patient/Guardian gives verbal consent for treatment and assignment of benefits for services provided during this visit. Patient/Guardian expressed understanding and agreed to proceed.   Patsi Boots, MSW, LCSW 11/09/2023,  2:09 PM

## 2023-11-15 ENCOUNTER — Ambulatory Visit (INDEPENDENT_AMBULATORY_CARE_PROVIDER_SITE_OTHER): Payer: MEDICAID | Admitting: Licensed Clinical Social Worker

## 2023-11-15 DIAGNOSIS — F331 Major depressive disorder, recurrent, moderate: Secondary | ICD-10-CM

## 2023-11-15 DIAGNOSIS — F4312 Post-traumatic stress disorder, chronic: Secondary | ICD-10-CM
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
## 2023-11-17 ENCOUNTER — Encounter (HOSPITAL_COMMUNITY): Payer: Self-pay | Admitting: Psychiatry

## 2023-11-17 ENCOUNTER — Telehealth (HOSPITAL_BASED_OUTPATIENT_CLINIC_OR_DEPARTMENT_OTHER): Payer: MEDICAID | Admitting: Psychiatry

## 2023-11-17 VITALS — Wt 227.0 lb

## 2023-11-17 DIAGNOSIS — F331 Major depressive disorder, recurrent, moderate: Secondary | ICD-10-CM

## 2023-11-17 DIAGNOSIS — F4312 Post-traumatic stress disorder, chronic: Secondary | ICD-10-CM | POA: Diagnosis not present

## 2023-11-17 MED ORDER — LURASIDONE HCL 40 MG PO TABS: 40.0000 mg | ORAL_TABLET | Freq: Every day | ORAL | 2 refills | Status: DC

## 2023-11-17 MED ORDER — FLUOXETINE HCL 20 MG PO CAPS: 20.0000 mg | ORAL_CAPSULE | Freq: Every day | ORAL | 2 refills | Status: DC
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
## 2023-11-23 ENCOUNTER — Ambulatory Visit (INDEPENDENT_AMBULATORY_CARE_PROVIDER_SITE_OTHER): Payer: MEDICAID | Admitting: Licensed Clinical Social Worker

## 2023-11-23 DIAGNOSIS — F4312 Post-traumatic stress disorder, chronic: Secondary | ICD-10-CM

## 2023-11-23 DIAGNOSIS — F331 Major depressive disorder, recurrent, moderate: Secondary | ICD-10-CM | POA: Diagnosis not present

## 2023-11-23 NOTE — Progress Notes (Signed)
 THERAPIST PROGRESS NOTE   Session Date: 11/23/2023  Session Time: 0910 - 9041 Virtual Visit via Video Note  I connected with Lynn [name] on 11/23/23 at  9:10 AM EDT by a video enabled telemedicine application and verified that I am speaking with the correct person using two identifiers.  Location: Patient: Lynn [name] Provider: Home Office   I discussed the limitations of evaluation and management by telemedicine and the availability of in person appointments. The patient expressed understanding and agreed to proceed.   The patient was advised to call back or seek an in-person evaluation if the symptoms worsen or if the condition fails to improve as anticipated.  I provided 48 minutes of non-face-to-face time during this encounter.  Participation Level: Active  Behavioral Response: CasualAlertAnxious and Euthymic  Type of Therapy: Individual Therapy  Treatment Goals addressed: - Lynn [name] will identify cognitive patterns and beliefs that support depression  - Increase coping skills to manage depression and improve ability to perform daily activities  ProgressTowards Goals: Progressing  Interventions: CBT, Motivational Interviewing, and Supportive  Summary: Lynn [name] is a 63 year old African-American female who presents for follow-up therapy appointment in the continued management of depressive symptoms.  Patient engaged in session, presenting in depressed and anxious moods, and congruent affect throughout. Pt engaged in introductory check-in, sharing of Doing good, sharing of enjoying the current warm weather. Shared of having a vision last night, believing that a man will be sent to her by God as a life partner, having learned in said vision to find love and desire to spend her life with this individual. 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 Pt further shared of an old  client having contacted her last week to request cleaning services, having originally declined due to pt's own health concerns, but finding self being led to do so following vision. Pt shared needs of clinician to keep me on track, processing clinician's abilities to support and motivate pt, and pt's own individual needs to be accountable and self-motivating. Explored availability of community resources/programs through [employer], or potentially joining [employer].  Patient responded well to interventions. Patient continues to meet criteria for MDD. Patient will continue to benefit from engagement in outpatient therapy due to being the least restrictive service to meet presenting needs.      09/28/2023    2:36 PM 08/30/2023    2:15 PM 08/16/2023   10:17 AM 08/02/2023   10:53 AM 07/12/2023   10:40 AM  Depression screen PHQ 2/9  Decreased Interest 0 1 3 0 1  Down, Depressed, Hopeless 0 1 1 0 0  PHQ - 2 Score 0 2 4 0 1  Altered sleeping 1 1 1  0 0  Tired, decreased energy 0 1 1 0 0  Change in appetite 0 2 3 0 0  Feeling bad or failure about yourself  0 0 1 0 0  Trouble concentrating 0 0 0 0 0  Moving slowly or fidgety/restless 0 0  0 0  Suicidal thoughts 0 0 0 0 0  PHQ-9 Score 1 6 10  0 1  Difficult doing work/chores Not difficult at all Not difficult at all Somewhat difficult  Somewhat difficult    Suicidal/Homicidal: No  Therapist Response: Clinician utilized CBT, MI, supportive reflection interventions to support pt in navigating thoughts and feelings surrounding presenting stressors and sxs.   Clinician actively greeted patient upon joining virtual visit, engaging  in introductory check-in, assessing presenting moods and affects, prompting pt's recounts of events of the past two weeks and factors contributing to presenting moods.  Actively listened to patient's recounts of recent events, and reduction of stressors, supporting patient and reflecting on improved/maintained management of  presenting symptoms.  Utilized open-ended questions to support patient in processing experienced vision, further exploring thoughts and feelings surrounding experience and reframing of possible motivating factors related to improvement of self.  Provided patient with support via exploration of local community resources, specifically local [employer], and other specific details.  Utilize behavioral activation interventions to support patient in determining next steps to aid in accomplishing identified goals.  Clinician reassessed severity of presenting sxs, and presence of any safety concerns. Therapist provided support and empathy to patient during session.  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   Plan: Return again in 2 weeks.  Diagnosis:  Encounter Diagnoses  Name Primary?   Moderate episode of recurrent major depressive disorder (HCC) Yes   Chronic post-traumatic stress disorder (PTSD)     Collaboration of Care: Psychiatrist AEB provider documentation available in EHR.  Patient/Guardian was advised Release of Information must be obtained prior to any record release in order to collaborate their care with an outside provider. Patient/Guardian was advised if they have not already done so to contact the registration department to sign all necessary forms in order for us  to release information regarding their care.   Consent: Patient/Guardian gives verbal consent for treatment and assignment of benefits for services provided during this visit. Patient/Guardian expressed understanding and agreed to proceed.   [name], MSW, LCSW 11/23/2023,  9:11 AM

## 2023-11-29 ENCOUNTER — Ambulatory Visit (INDEPENDENT_AMBULATORY_CARE_PROVIDER_SITE_OTHER): Payer: MEDICAID | Admitting: Licensed Clinical Social Worker

## 2023-11-29 DIAGNOSIS — F331 Major depressive disorder, recurrent, moderate: Secondary | ICD-10-CM

## 2023-11-29 DIAGNOSIS — F4312 Post-traumatic stress disorder, chronic: Secondary | ICD-10-CM

## 2023-11-29 NOTE — Progress Notes (Unsigned)
 THERAPIST PROGRESS NOTE   Session Date: 11/29/2023  Session Time: 1407 - 1505 Virtual Visit via Video Note  I connected with Lynn [name] on 11/29/23 at  2:00 PM EDT by a video enabled telemedicine application and verified that I am speaking with the correct person using two identifiers.  Location: Patient: Home Provider: Home Office   I discussed the limitations of evaluation and management by telemedicine and the availability of in person appointments. The patient expressed understanding and agreed to proceed.  I discussed the assessment and treatment plan with the patient. The patient was provided an opportunity to ask questions and all were answered. The patient agreed with the plan and demonstrated an understanding of the instructions.   The patient was advised to call back or seek an in-person evaluation if the symptoms worsen or if the condition fails to improve as anticipated.  I provided 57 minutes of non-face-to-face time during this encounter.  Participation Level: Active  Behavioral Response: CasualAlertAnxious and Euthymic  Type of Therapy: Individual Therapy  Treatment Goals addressed: - Lynn [name] will identify cognitive patterns and beliefs that support depression  - Increase coping skills to manage depression and improve ability to perform daily activities  ProgressTowards Goals: Progressing  Interventions: CBT, Motivational Interviewing, and Supportive  Summary: Lynn [name] is a 63 year old African-American female who presents for follow-up therapy appointment in the continued management of depressive symptoms.  Patient engaged in session, presenting in overall pleasant moods with periods of mild anxiousness when exploring recent stressors, and congruent affect throughout. Pt engaged in introductory check-in, sharing It's been going good, up until yesterday, sharing of having done well throughout the week, having gotten up and taken meds daily as prescribed,  sharing of [facility] and obtaining membership and scholarship process information, currently awaiting support from community support personnel to upload financial information. 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 Shared of having an issue with boundaries with children, detailing recent interactions with son whom contacted pt asking for monetary support, sharing of feeling bad for having not supported son as she typically would have, finding self experiencing difficulties and ruminating on son's financial struggles. Processed individual means of navigating challenges related to upholding boundaries with children and increasing communication to support in the effective implementation and management of boundaries.   Patient responded well to interventions. Patient continues to meet criteria for MDD. Patient will continue to benefit from engagement in outpatient therapy due to being the least restrictive service to meet presenting needs.      09/28/2023    2:36 PM 08/30/2023    2:15 PM 08/16/2023   10:17 AM 08/02/2023   10:53 AM 07/12/2023   10:40 AM  Depression screen PHQ 2/9  Decreased Interest 0 1 3 0 1  Down, Depressed, Hopeless 0 1 1 0 0  PHQ - 2 Score 0 2 4 0 1  Altered sleeping 1 1 1  0 0  Tired, decreased energy 0 1 1 0 0  Change in appetite 0 2 3 0 0  Feeling bad or failure about yourself  0 0 1 0 0  Trouble concentrating 0 0 0 0 0  Moving slowly or fidgety/restless 0 0  0 0  Suicidal thoughts 0 0 0 0 0  PHQ-9 Score 1 6 10  0 1  Difficult doing work/chores Not difficult at all Not difficult at all Somewhat difficult  Somewhat difficult    Suicidal/Homicidal: No  Therapist Response:  Clinician utilized CBT, MI, supportive reflection interventions to support pt in navigating thoughts and feelings surrounding presenting stressors and sxs.   Clinician actively greeted pt upon joining virtual session, engaging in introductory  check-in, assessing moods and affect upon presentation, and prompting pt's engagement in recounts of daily events and factors contributing to presenting moods. Actively listened to pt's reports of doing well over the past week, and identifying of minor challenges encountered surrounding  boundaries with children and their financial needs for support. Provided validation and support for pt's identified thoughts and feelings, utilizing open ended questions to further support pt in processing of thoughts, feelings, and perspectives surrounding recent stressors. Utilized socratic questioning to assist pt in greater critical processing of shared thoughts and perspectives surrounding boundaries and challenges implementing them within relationships with two sons. Provided pt support in processing how to provide support for adult son and grandchildren while adhering to boundaries, as well as processing necessary communication that is required when navigating boundaries within relationships.  Clinician reassessed severity of presenting sxs, and presence of any safety concerns. Therapist provided support and empathy to patient during session.  Homework: Continue process of joining local [employer]/gym to increase physical activity and social interactions.   Plan: Return again in 2 weeks.  Diagnosis:  Encounter Diagnoses  Name Primary?   Moderate episode of recurrent major depressive disorder (HCC) Yes   Chronic post-traumatic stress disorder (PTSD)     Collaboration of Care: Psychiatrist AEB provider documentation available in EHR.  Patient/Guardian was advised Release of Information must be obtained prior to any record release in order to collaborate their care with an outside provider. Patient/Guardian was advised if they have not already done so to contact the registration department to sign all necessary forms in order for us  to release information regarding their care.   Consent: Patient/Guardian gives  verbal consent for treatment and assignment of benefits for services provided during this visit. Patient/Guardian expressed understanding and agreed to proceed.   Lynn [name], MSW, LCSW 11/29/2023,  2:09 PM

## 2023-12-06 ENCOUNTER — Ambulatory Visit (INDEPENDENT_AMBULATORY_CARE_PROVIDER_SITE_OTHER): Payer: MEDICAID | Admitting: Licensed Clinical Social Worker

## 2023-12-06 DIAGNOSIS — F4312 Post-traumatic stress disorder, chronic: Secondary | ICD-10-CM

## 2023-12-06 DIAGNOSIS — F331 Major depressive disorder, recurrent, moderate: Secondary | ICD-10-CM

## 2023-12-06 NOTE — Progress Notes (Signed)
 THERAPIST PROGRESS NOTE   Session Date: 12/06/2023  Session Time: 1005 - 1103 Virtual Visit via Video Note  I connected with Lynn [name] on 12/06/23 at 10:00 AM EDT by a video enabled telemedicine application and verified that I am speaking with the correct person using two identifiers.  Location: Patient: Home Provider: Home Office   I discussed the limitations of evaluation and management by telemedicine and the availability of in person appointments. The patient expressed understanding and agreed to proceed.   The patient was advised to call back or seek an in-person evaluation if the symptoms worsen or if the condition fails to improve as anticipated.  I provided 58 minutes of non-face-to-face time during this encounter.  Participation Level: Active  Behavioral Response: CasualAlertAnxious and Depressed  Type of Therapy: Individual Therapy  Treatment Goals addressed: - [place] will identify cognitive patterns and beliefs that support depression  - Increase coping skills to manage depression and improve ability to perform daily activities  ProgressTowards Goals: Progressing  Interventions: CBT, Motivational Interviewing, and Supportive  Summary: Lynn [name] is a 63 year old African-American female who presents for follow-up therapy appointment in the continued management of depressive symptoms.  Patient engaged in session, presenting in depressed moods, and congruent affect throughout. Pt engaged in introductory check-in, sharing Not good this time, detailing of having received a call yesterday from foster family from childhood, having invited pt to foster mother's grave site for a family function, experiencing increased stress and not wanting to attend event due to hx of challenges within family during childhood, sharing of no desire to see the family members she recalls experiencing challenges with. Pt extensively detailed hx of going into foster care, upbringing during  foster care, challenges within family relationship throughout childhood, and pt's challenges surrounding dealing with loss of foster mother in 2020. Pt further detailed having minimized family hx with own children, having not shared with children of mental health challenges within family.  Actively processed implementing boundaries with both the family in order to protect self.  Engaged in reassessing presenting depressive symptoms via PHQ-9, and reflecting on maintained minimal screening scores.  Briefly reflected on [employer] membership status, expressing excitement with anticipation of learning details surrounding membership and fees within the coming week.  Patient responded well to interventions. Patient continues to meet criteria for MDD. Patient will continue to benefit from engagement in outpatient therapy due to being the least restrictive service to meet presenting needs.      12/06/2023   10:57 AM 09/28/2023    2:36 PM 08/30/2023    2:15 PM 08/16/2023   10:17 AM 08/02/2023   10:53 AM  Depression screen PHQ 2/9  Decreased Interest 0 0 1 3 0  Down, Depressed, Hopeless 0 0 1 1 0  PHQ - 2 Score 0 0 2 4 0  Altered sleeping 0 1 1 1  0  Tired, decreased energy 0 0 1 1 0  Change in appetite 1 0 2 3 0  Feeling bad or failure about yourself  0 0 0 1 0  Trouble concentrating 0 0 0 0 0  Moving slowly or fidgety/restless 0 0 0  0  Suicidal thoughts 0 0 0 0 0  PHQ-9 Score 1 1 6 10  0  Difficult doing work/chores Not difficult at all Not difficult at all Not difficult at all Somewhat difficult     Suicidal/Homicidal: No  Therapist Response: Clinician utilized CBT, MI, supportive reflection interventions to support pt in navigating thoughts and feelings surrounding presenting  stressors and sxs.   Clinician actively greeted patient upon joining virtual visit, assessing presenting moods and affect, and engaging in introductory check-in. Utilized open-ended questions and efforts to elicit recounts of events  of the past week, and factors contributing to presenting moods. Actively listened to patient's reflections of events, providing support and empathy to patient and processing of increased stressors. Utilized Socratic questioning to support patient in greater critical processing of thoughts, feelings, and perspectives surrounding presenting challenges, and further supporting patient in processing childhood history proving to impact feelings and perspectives. Engaged patient in reassessing presenting depressive symptoms experienced over the past 2 weeks via PHQ-9, further reviewing maintained reduction in screening scores.   Utilized CBT and MI interventions to explore patient's continued efforts in obtaining membership at [employer] and intent to utilize exercise as a means of managing stress.  Clinician reassessed severity of presenting sxs, and presence of any safety concerns. Therapist provided support and empathy to patient during session.  Homework:  None.  Plan: Return again in 1 weeks.  Diagnosis:  Encounter Diagnoses  Name Primary?   Moderate episode of recurrent major depressive disorder (HCC) Yes   Chronic post-traumatic stress disorder (PTSD)      Collaboration of Care: Psychiatrist AEB provider documentation available in EHR.  Patient/Guardian was advised Release of Information must be obtained prior to any record release in order to collaborate their care with an outside provider. Patient/Guardian was advised if they have not already done so to contact the registration department to sign all necessary forms in order for us  to release information regarding their care.   Consent: Patient/Guardian gives verbal consent for treatment and assignment of benefits for services provided during this visit. Patient/Guardian expressed understanding and agreed to proceed.   [name], MSW, LCSW 12/06/2023,  10:06 AM

## 2023-12-13 ENCOUNTER — Ambulatory Visit (INDEPENDENT_AMBULATORY_CARE_PROVIDER_SITE_OTHER): Payer: MEDICAID | Admitting: Licensed Clinical Social Worker

## 2023-12-13 DIAGNOSIS — F4312 Post-traumatic stress disorder, chronic: Secondary | ICD-10-CM | POA: Diagnosis not present

## 2023-12-13 DIAGNOSIS — F331 Major depressive disorder, recurrent, moderate: Secondary | ICD-10-CM | POA: Diagnosis not present

## 2023-12-13 NOTE — Progress Notes (Signed)
 THERAPIST PROGRESS NOTE   Session Date: 12/13/2023  Session Time: 1013 - 1103 Virtual Visit via Video Note  I connected with Lynn [name] on 12/13/23 at 10:00 AM EDT by a video enabled telemedicine application and verified that I am speaking with the correct person using two identifiers.  Location: Patient: Home Provider: Home Office   I discussed the limitations of evaluation and management by telemedicine and the availability of in person appointments. The patient expressed understanding and agreed to proceed.   The patient was advised to call back or seek an in-person evaluation if the symptoms worsen or if the condition fails to improve as anticipated.  I provided 50 minutes of non-face-to-face time during this encounter.  Participation Level: Active  Behavioral Response: CasualAlertEuthymic  Type of Therapy: Individual Therapy  Treatment Goals addressed: - [place] will identify cognitive patterns and beliefs that support depression  - Increase coping skills to manage depression and improve ability to perform daily activities  ProgressTowards Goals: Progressing  Interventions: CBT, Motivational Interviewing, and Supportive  Summary: Lynn [name] is a 63 year old African-American female who presents for follow-up therapy appointment in the continued management of depressive symptoms.  Patient engaged in session, presenting in overall pleasant moods, and congruent affect throughout. Pt engaged in introductory check-in, sharing it's good, further detailing of being tired, having had a lot going on over the past weekend, sharing of having been up early to take the dog out, being daughters dog, and continuing to detail recent weekend event engaging with daughter, seeing oldest son on his birthday, however finding interactions to become increasingly frustrating due to son's substance use. Pt further reflected on events over recent days, sharing of having been approached by a  random female while walking daughters puppy, finding self feeling increased fear of potential vulnerability and harm. Pt detailed factors contributing to increased feeling of vulnerability, detailing individual challenges with moving swiftly due to prior knee injury/surgery. Pt detailed of having missed all medications on Saturday, proving to have taken them all yesterday, and today, finding self to have experienced no significant concerns.  Patient briefly detailed continued weight to hear from [facility] regarding membership application and scholarship eligibility, detailing slight frustration due to have originally been advised she would hear something within 10 days, and maintained eagerness to begin working out.  Patient responded well to interventions. Patient continues to meet criteria for MDD. Patient will continue to benefit from engagement in outpatient therapy due to being the least restrictive service to meet presenting needs.      12/06/2023   10:57 AM 09/28/2023    2:36 PM 08/30/2023    2:15 PM 08/16/2023   10:17 AM 08/02/2023   10:53 AM  Depression screen PHQ 2/9  Decreased Interest 0 0 1 3 0  Down, Depressed, Hopeless 0 0 1 1 0  PHQ - 2 Score 0 0 2 4 0  Altered sleeping 0 1 1 1  0  Tired, decreased energy 0 0 1 1 0  Change in appetite 1 0 2 3 0  Feeling bad or failure about yourself  0 0 0 1 0  Trouble concentrating 0 0 0 0 0  Moving slowly or fidgety/restless 0 0 0  0  Suicidal thoughts 0 0 0 0 0  PHQ-9 Score 1 1 6 10  0  Difficult doing work/chores Not difficult at all Not difficult at all Not difficult at all Somewhat difficult     Suicidal/Homicidal: No  Therapist Response: Clinician utilized CBT, MI, supportive reflection interventions  to support pt in navigating thoughts and feelings surrounding presenting stressors and sxs.   Clinician actively greeted patient upon joining virtual visit, assessing presenting moods and affect, engaging in introductory check-in. Utilized open-ended  questions in efforts to prompt patient's recounts of recent events and factors contributing to presenting moods. Actively listened to patient's reflections of events of the past week, providing support and validation for patient's identified stressors experienced over recent days, and specific challenges surrounding interactions with oldest son and daughter. Further supported patient and validating expressed thoughts, feelings, and perspectives surrounding continued challenges experienced within relationships with adult children. Provided patient support and validation of continued efforts towards behavioral activation and desires to begin working out regularly at [facility].  Clinician reassessed severity of presenting sxs, and presence of any safety concerns. Therapist provided support and empathy to patient during session.  Homework:  None.  Plan: Return again in 1 weeks.  Diagnosis:  Encounter Diagnoses  Name Primary?   Moderate episode of recurrent major depressive disorder (HCC) Yes   Chronic post-traumatic stress disorder (PTSD)       Collaboration of Care: Psychiatrist AEB provider documentation available in EHR.  Patient/Guardian was advised Release of Information must be obtained prior to any record release in order to collaborate their care with an outside provider. Patient/Guardian was advised if they have not already done so to contact the registration department to sign all necessary forms in order for us  to release information regarding their care.   Consent: Patient/Guardian gives verbal consent for treatment and assignment of benefits for services provided during this visit. Patient/Guardian expressed understanding and agreed to proceed.   [name], MSW, LCSW 12/13/2023,  10:14 AM

## 2023-12-20 ENCOUNTER — Ambulatory Visit (INDEPENDENT_AMBULATORY_CARE_PROVIDER_SITE_OTHER): Payer: MEDICAID | Admitting: Licensed Clinical Social Worker

## 2023-12-20 DIAGNOSIS — F4312 Post-traumatic stress disorder, chronic: Secondary | ICD-10-CM

## 2023-12-20 DIAGNOSIS — F331 Major depressive disorder, recurrent, moderate: Secondary | ICD-10-CM | POA: Diagnosis not present

## 2023-12-20 NOTE — Progress Notes (Unsigned)
 THERAPIST PROGRESS NOTE   Session Date: 12/20/2023  Session Time: 1006 - 1104 Virtual Visit via Video Note  I connected with Lynn [name] on 12/20/23 at 10:00 AM EDT by a video enabled telemedicine application and verified that I am speaking with the correct person using two identifiers.  Location: Patient: Home Provider: Home Office   I discussed the limitations of evaluation and management by telemedicine and the availability of in person appointments. The patient expressed understanding and agreed to proceed.   The patient was advised to call back or seek an in-person evaluation if the symptoms worsen or if the condition fails to improve as anticipated.  I provided 58 minutes of non-face-to-face time during this encounter.  Participation Level: Active  Behavioral Response: CasualAlertEuthymic  Type of Therapy: Individual Therapy  Treatment Goals addressed: - Lynn [name] will identify cognitive patterns and beliefs that support depression  - Increase coping skills to manage depression and improve ability to perform daily activities  ProgressTowards Goals: Progressing  Interventions: CBT, Motivational Interviewing, and Supportive  Summary: Lynn [name] is a 63 year old African-American female who presents for follow-up therapy appointment in the continued management of depressive symptoms.  Patient engaged in session, presenting in overall pleasant moods, and congruent affect throughout. Pt engaged in introductory check-in, sharing Everything's been going pretty good, besides the little stuff. Reassessed depressive sxs via PHQ-9, further processing maintained reduction is sxs, noting of no issues sleeping, no issues with fatigue, poor appetite daily with attempting to make self eat however just nibbling on popcorn or cheese crackers, eating maybe 1x/daily. Further processed having taken Ozempic for past 56yr, exploring understanding of calorie intake/diet and sedentary  lifestyle proving to contribute to limited weight loss. Detailed of having received [employer] membership, having gone 1x, but found self uncomfortable and unable to workout around others, resulting in leaving, having followed back up with [facility] regarding securing a personal trainer further detailing motivating factors. Shared of having recent [facility] assessment with resident coordinator, having witnessed pt's interactions with [employer] delivery proving unable to register name on delivery being that of pt's son, detailing of having set up appt with PCP to get tested for Alzheimer's and Dementia, further sharing of other little observations pt has made.   Patient responded well to interventions. Patient continues to meet criteria for MDD. Patient will continue to benefit from engagement in outpatient therapy due to being the least restrictive service to meet presenting needs.      12/20/2023   10:08 AM 12/06/2023   10:57 AM 09/28/2023    2:36 PM 08/30/2023    2:15 PM 08/16/2023   10:17 AM  Depression screen PHQ 2/9  Decreased Interest 0 0 0 1 3  Down, Depressed, Hopeless 0 0 0 1 1  PHQ - 2 Score 0 0 0 2 4  Altered sleeping 0 0 1 1 1   Tired, decreased energy 0 0 0 1 1  Change in appetite 3 1 0 2 3  Feeling bad or failure about yourself  0 0 0 0 1  Trouble concentrating 0 0 0 0 0  Moving slowly or fidgety/restless 0 0 0 0   Suicidal thoughts 0 0 0 0 0  PHQ-9 Score 3 1 1 6 10   Difficult doing work/chores Not difficult at all Not difficult at all Not difficult at all Not difficult at all Somewhat difficult    Suicidal/Homicidal: No  Therapist Response: Clinician utilized CBT, MI, supportive reflection interventions to support pt in navigating thoughts and feelings  surrounding presenting stressors and sxs.   Clinician *** actively greeted patient upon joining virtual visit, assessing presenting moods and affect, engaging in introductory check-in. Utilized open-ended questions in efforts to prompt  patient's recounts of recent events and factors contributing to presenting moods. Actively listened to patient's reflections of events of the past week, providing support and validation for patient's identified stressors experienced over recent days, and specific challenges surrounding interactions with oldest son and daughter. Further supported patient and validating expressed thoughts, feelings, and perspectives surrounding continued challenges experienced within relationships with adult children. Provided patient support and validation of continued efforts towards behavioral activation and desires to begin working out regularly at [facility].  Clinician reassessed severity of presenting sxs, and presence of any safety concerns. Therapist provided support and empathy to patient during session.  Homework:  None.  Plan: Return again in 1 weeks.  Diagnosis:  Encounter Diagnoses  Name Primary?   Moderate episode of recurrent major depressive disorder (HCC) Yes   Chronic post-traumatic stress disorder (PTSD)       Collaboration of Care: Psychiatrist AEB provider documentation available in EHR.  Patient/Guardian was advised Release of Information must be obtained prior to any record release in order to collaborate their care with an outside provider. Patient/Guardian was advised if they have not already done so to contact the registration department to sign all necessary forms in order for us  to release information regarding their care.   Consent: Patient/Guardian gives verbal consent for treatment and assignment of benefits for services provided during this visit. Patient/Guardian expressed understanding and agreed to proceed.   Lynn [name], MSW, LCSW 12/20/2023,  10:07 AM

## 2023-12-27 ENCOUNTER — Ambulatory Visit (INDEPENDENT_AMBULATORY_CARE_PROVIDER_SITE_OTHER): Payer: MEDICAID | Admitting: Licensed Clinical Social Worker

## 2023-12-27 DIAGNOSIS — F4312 Post-traumatic stress disorder, chronic: Secondary | ICD-10-CM | POA: Diagnosis not present

## 2023-12-27 DIAGNOSIS — F331 Major depressive disorder, recurrent, moderate: Secondary | ICD-10-CM

## 2023-12-27 NOTE — Progress Notes (Signed)
 THERAPIST PROGRESS NOTE   Session Date: 12/27/2023  Session Time: 1007 - 1105 Virtual Visit via Video Note  I connected with Lynn [name] on 12/27/23 at 10:00 AM EDT by a video enabled telemedicine application and verified that I am speaking with the correct person using two identifiers.  Location: Patient: Cousin's home [facility]) Provider: Home Office   I discussed the limitations of evaluation and management by telemedicine and the availability of in person appointments. The patient expressed understanding and agreed to proceed.  The patient was advised to call back or seek an in-person evaluation if the symptoms worsen or if the condition fails to improve as anticipated.  I provided 58 minutes of non-face-to-face time during this encounter.  Participation Level: Active  Behavioral Response: CasualAlertEuthymic  Type of Therapy: Individual Therapy  Treatment Goals addressed: - Lynn [name] will identify cognitive patterns and beliefs that support depression  - Increase coping skills to manage depression and improve ability to perform daily activities  ProgressTowards Goals: Progressing  Interventions: CBT, Motivational Interviewing, and Supportive  Summary: Lynn [name] is a 63 year old African-American female who presents for follow-up therapy appointment in the continued management of depressive symptoms.  Patient engaged in session, presenting in overall pleasant moods, and congruent affect throughout. Pt engaged in introductory check-in, sharing Doing good, further detailing of having traveled to [place] to cousin's home to visit church, having enjoyed and felt as though she really needed to attend. Pt further shared of the family reunion/memorial having occurred this past weekend, however not attending as previously determining doing so to be challenging for her.  Patient actively engaged in detailing experienced stress surrounding concerns of how details are  reflected in patient's health record, sharing of having received notification in my chart of previous visit with provider as well as notifications having reflected treatment for specific diagnosis of major depressive disorder, without psychosis, further sharing of stress and anxious thoughts related to how patient is labeled within medical chart, as well as who has access to such information.  Patient appeared to be increasingly anxious, fixating on what is reflected in patient's chart in relation to diagnoses, as well as whom may be able to access such information.  Patient proved to question the back provided by clinician, as well as clinicians provided hypothetical examples for which treatment history and history of diagnoses would be relevant.  Patient acknowledged individual history of hallucinations as well as proving understanding of various medical conditions that can result in altered mental status.  Patient responded well to interventions. Patient continues to meet criteria for MDD. Patient will continue to benefit from engagement in outpatient therapy due to being the least restrictive service to meet presenting needs.      12/20/2023   10:08 AM 12/06/2023   10:57 AM 09/28/2023    2:36 PM 08/30/2023    2:15 PM 08/16/2023   10:17 AM  Depression screen PHQ 2/9  Decreased Interest 0 0 0 1 3  Down, Depressed, Hopeless 0 0 0 1 1  PHQ - 2 Score 0 0 0 2 4  Altered sleeping 0 0 1 1 1   Tired, decreased energy 0 0 0 1 1  Change in appetite 3 1 0 2 3  Feeling bad or failure about yourself  0 0 0 0 1  Trouble concentrating 0 0 0 0 0  Moving slowly or fidgety/restless 0 0 0 0   Suicidal thoughts 0 0 0 0 0  PHQ-9 Score 3 1 1 6 10   Difficult doing  work/chores Not difficult at all Not difficult at all Not difficult at all Not difficult at all Somewhat difficult    Suicidal/Homicidal: No  Therapist Response: Clinician utilized CBT, MI, supportive reflection interventions to support pt in navigating  thoughts and feelings surrounding presenting stressors and sxs.   Clinician actively greeted patient upon joining today's virtual visit, assessing presenting moods and affect, engaging in introductory check-in. Openly engaged patient in introductory check-in, utilizing open-ended questioning to elicit patient's input of daily and recent events, and presenting stressors. Actively listened the patient's recounts of events of the past week, providing support and validation of identified feelings surrounding recent trip to [place] to spend time with cousin and attend church. Utilized psycho Ed, MI, and supportive reflection interventions to aid patient in processing thoughts and feelings surrounding presenting stress related to concerns regarding medical history and treatment documentation contained within EHR.  Clinician reassessed severity of presenting sxs, and presence of any safety concerns. Therapist provided support and empathy to patient during session.  Homework:  None.  Plan: Return again in 3 weeks.  Diagnosis:  Encounter Diagnoses  Name Primary?   Moderate episode of recurrent major depressive disorder (HCC) Yes   Chronic post-traumatic stress disorder (PTSD)     Collaboration of Care: Psychiatrist AEB provider documentation available in EHR.  Patient/Guardian was advised Release of Information must be obtained prior to any record release in order to collaborate their care with an outside provider. Patient/Guardian was advised if they have not already done so to contact the registration department to sign all necessary forms in order for us  to release information regarding their care.   Consent: Patient/Guardian gives verbal consent for treatment and assignment of benefits for services provided during this visit. Patient/Guardian expressed understanding and agreed to proceed.   Lynn [name], MSW, LCSW 12/27/2023,  10:07 AM

## 2024-01-11 ENCOUNTER — Other Ambulatory Visit: Payer: Self-pay | Admitting: Medical Genetics

## 2024-01-13 ENCOUNTER — Other Ambulatory Visit
Admission: RE | Admit: 2024-01-13 | Discharge: 2024-01-13 | Disposition: A | Discharge status: Home / Self Care | Payer: MEDICAID | Source: Ambulatory Visit | Attending: Medical Genetics | Admitting: Medical Genetics

## 2024-01-18 ENCOUNTER — Ambulatory Visit (INDEPENDENT_AMBULATORY_CARE_PROVIDER_SITE_OTHER): Payer: MEDICAID | Admitting: Licensed Clinical Social Worker

## 2024-01-18 DIAGNOSIS — F331 Major depressive disorder, recurrent, moderate: Secondary | ICD-10-CM | POA: Diagnosis not present
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
## 2024-01-24 LAB — GENECONNECT MOLECULAR SCREEN: Genetic Analysis Overall Interpretation: NEGATIVE

## 2024-02-01 ENCOUNTER — Ambulatory Visit (HOSPITAL_COMMUNITY): Payer: MEDICAID | Admitting: Licensed Clinical Social Worker

## 2024-02-03 ENCOUNTER — Ambulatory Visit (INDEPENDENT_AMBULATORY_CARE_PROVIDER_SITE_OTHER): Payer: MEDICAID | Admitting: Licensed Clinical Social Worker

## 2024-02-03 DIAGNOSIS — F331 Major depressive disorder, recurrent, moderate: Secondary | ICD-10-CM | POA: Diagnosis not present

## 2024-02-03 DIAGNOSIS — F4312 Post-traumatic stress disorder, chronic: Secondary | ICD-10-CM | POA: Diagnosis not present
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
## 2024-02-15 ENCOUNTER — Ambulatory Visit (INDEPENDENT_AMBULATORY_CARE_PROVIDER_SITE_OTHER): Payer: MEDICAID | Admitting: Licensed Clinical Social Worker

## 2024-02-15 DIAGNOSIS — F4312 Post-traumatic stress disorder, chronic: Secondary | ICD-10-CM | POA: Diagnosis not present

## 2024-02-15 DIAGNOSIS — F331 Major depressive disorder, recurrent, moderate: Secondary | ICD-10-CM
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
## 2024-02-17 ENCOUNTER — Other Ambulatory Visit: Payer: Self-pay

## 2024-02-17 ENCOUNTER — Ambulatory Visit (HOSPITAL_BASED_OUTPATIENT_CLINIC_OR_DEPARTMENT_OTHER): Payer: MEDICAID | Admitting: Psychiatry

## 2024-02-17 ENCOUNTER — Encounter (HOSPITAL_COMMUNITY): Payer: Self-pay | Admitting: Psychiatry

## 2024-02-17 VITALS — BP 126/85 | HR 60 | Ht 65.0 in | Wt 224.0 lb

## 2024-02-17 DIAGNOSIS — F4312 Post-traumatic stress disorder, chronic: Secondary | ICD-10-CM | POA: Diagnosis not present

## 2024-02-17 DIAGNOSIS — F331 Major depressive disorder, recurrent, moderate: Secondary | ICD-10-CM | POA: Diagnosis not present

## 2024-02-17 MED ORDER — LURASIDONE HCL 40 MG PO TABS: 40.0000 mg | ORAL_TABLET | Freq: Every day | ORAL | 2 refills | Status: DC

## 2024-02-17 MED ORDER — FLUOXETINE HCL 20 MG PO CAPS: 20.0000 mg | ORAL_CAPSULE | Freq: Every day | ORAL | 2 refills | Status: DC
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
## 2024-03-07 ENCOUNTER — Ambulatory Visit (INDEPENDENT_AMBULATORY_CARE_PROVIDER_SITE_OTHER): Payer: MEDICAID | Admitting: Licensed Clinical Social Worker

## 2024-03-07 DIAGNOSIS — F331 Major depressive disorder, recurrent, moderate: Secondary | ICD-10-CM | POA: Diagnosis not present

## 2024-03-07 DIAGNOSIS — F4312 Post-traumatic stress disorder, chronic: Secondary | ICD-10-CM
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
## 2024-03-20 ENCOUNTER — Encounter (HOSPITAL_COMMUNITY): Payer: Self-pay

## 2024-03-20 ENCOUNTER — Ambulatory Visit (INDEPENDENT_AMBULATORY_CARE_PROVIDER_SITE_OTHER): Payer: MEDICAID | Admitting: Licensed Clinical Social Worker

## 2024-03-20 DIAGNOSIS — F4312 Post-traumatic stress disorder, chronic: Secondary | ICD-10-CM | POA: Diagnosis not present

## 2024-03-20 DIAGNOSIS — F331 Major depressive disorder, recurrent, moderate: Secondary | ICD-10-CM | POA: Diagnosis not present

## 2024-03-20 NOTE — Progress Notes (Signed)
 THERAPIST PROGRESS NOTE   Session Date: 03/20/2024  Session Time: 0805 - 0908 Virtual Visit via Video Note  I connected with Lynn [name] on 03/20/24 at  8:00 AM EDT by a video enabled telemedicine application and verified that I am speaking with the correct person using two identifiers.  Location: Patient: Home Provider: Home Office   I discussed the limitations of evaluation and management by telemedicine and the availability of in person appointments. The patient expressed understanding and agreed to proceed.  I discussed the assessment and treatment plan with the patient. The patient was provided an opportunity to ask questions and all were answered. The patient agreed with the plan and demonstrated an understanding of the instructions.   The patient was advised to call back or seek an in-person evaluation if the symptoms worsen or if the condition fails to improve as anticipated.  I provided 63 minutes of non-face-to-face time during this encounter.  Participation Level: Active  Behavioral Response: CasualAlertEuthymic  Type of Therapy: Individual Therapy  Treatment Goals addressed:  Progressing (2) LTG: Increase coping skills to manage depression and improve ability to perform daily activities (OP Depression) STG: Lynn [name] will identify cognitive patterns and beliefs that support depression (OP Depression)  Initial (1) LTG: Get me to the gym; Increase physical activity to support improvements in physical health and improvements in moods, AEB securing physical trainer and attending the gym at least 2x/week. (OP Depression)  Completed/Met (4) LTG: Reduce frequency, intensity, and duration of depression symptoms so that daily functioning is improved (OP Depression) STG: Reduce overall depression score by a minimum of 25% on the Patient Health Questionnaire (PHQ-9) (OP Depression) LTG: Lynn [name] will score less than 5 on the Generalized Anxiety Disorder 7 Scale (GAD-7)  (Anxiety) STG: Report a decrease in anxiety symptoms as evidenced by an overall reduction in anxiety score by a minimum of 25% on the Generalized Anxiety Disorder Scale (GAD-7) (Anxiety)  ProgressTowards Goals: Progressing  Interventions: CBT, Motivational Interviewing, and Supportive  Summary: Lynn [name] is a 63 year old African-American female who presents for follow-up therapy appointment in the continued management of depressive symptoms.  Patient engaged in session, presenting in overall depressed moods, and congruent affect throughout. Pt engaged in introductory check-in, sharing I'm still amongst the living, further detailing of difficulties getting up this morning. Pt further detailed recent events, sharing of having attended neurological appt, having challenges completing [facility] screening test, as well as learning of having had a stroke a number of years ago by neurologist, completing labs, recommending speech therapy, sleep apnea test, and receive another MRI. Shared of having connected with speech therapist, however due to provider being located in [place], needing to follow up with Neurology to re-refer to closer location. Further processed individual challenges surrounding thoughts and feelings in relation to navigating neurological concerns and further evaluation. Engaged in review/revision of tx plan, processing progressions and continued areas for work, exploring pt's difficulties in committing to attending gym, and desires to explore challenges further.     02/03/2024    2:51 PM 12/20/2023   10:08 AM 12/06/2023   10:57 AM 09/28/2023    2:36 PM 08/30/2023    2:15 PM  Depression screen PHQ 2/9  Decreased Interest 0 0 0 0 1  Down, Depressed, Hopeless 0 0 0 0 1  PHQ - 2 Score 0 0 0 0 2  Altered sleeping 0 0 0 1 1  Tired, decreased energy 0 0 0 0 1  Change in appetite 3 3 1  0 2  Feeling bad or failure about yourself  0 0 0 0 0  Trouble concentrating 0 0 0 0 0  Moving slowly or  fidgety/restless 0 0 0 0 0  Suicidal thoughts 0 0 0 0 0  PHQ-9 Score 3 3 1 1 6   Difficult doing work/chores Not difficult at all Not difficult at all Not difficult at all Not difficult at all Not difficult at all    Suicidal/Homicidal: No  Therapist Response:  Clinician openly greeted patient upon joining today's virtual visit, assessing presenting moods and affect, prompting pt's details of daily events and observed moods. Further engaged pt, utilizing open ended questions in eliciting recounts of events of recent weeks, newly identified stressors, ongoing stressors, observed impact on moods, and efforts navigating challenges. Utilized active listening techniques to provide support and validation of pt's recounts of events, further supporting processing of identified thoughts, feelings, and perspectives, supporting pt in challenging irrational thought processes and identifying more rational thoughts.  Utilized CBT, psychoed, MI, psycho-ed, and supportive reflection interventions in aiding pt in processing thoughts and feelings, and navigate presenting stress. Clinician reassessed severity of presenting sxs, and presence of any safety concerns.  [x]  Cognitive Challenging [x]  Cognitive Refocusing [x]  Cognitive Reframing  []  Communication Skills []  Compliance Issues []  DBT []  Exploration of Coping Patterns [x]  Exploration of Emotions [x]  Exploration of Relationship Patterns []  Guided Imagery []  Interactive Feedback []  Interpersonal Resolutions []  Mindfulness Training []  Preventative Services [x]  Psycho-Education  []  Relaxation/Deep Breathing [x]  Review of Treatment Plan/Progress []  Role-Play/Behavioral Rehearsal  [x]  Structured Problem Solving [x]  Supportive Reflection []  Symptom Management  []  Other   Patient responded well to interventions. Patient continues to meet criteria for MDD. Patient will continue to benefit from engagement in outpatient therapy due to being the least restrictive  service to meet presenting needs. Pt proves to be maintaining variable progress towards identified goals.  Homework:  Contact PCP to sign off on need for personal trainer w/ gym.  Plan: Return again in 2 weeks.  Diagnosis:  Encounter Diagnoses  Name Primary?   Moderate episode of recurrent major depressive disorder (HCC) Yes   Chronic post-traumatic stress disorder (PTSD)     Collaboration of Care: Psychiatrist AEB provider documentation available in EHR.  Patient/Guardian was advised Release of Information must be obtained prior to any record release in order to collaborate their care with an outside provider. Patient/Guardian was advised if they have not already done so to contact the registration department to sign all necessary forms in order for us  to release information regarding their care.   Consent: Patient/Guardian gives verbal consent for treatment and assignment of benefits for services provided during this visit. Patient/Guardian expressed understanding and agreed to proceed.   [name], MSW, LCSW 03/20/2024,  8:06 AM

## 2024-03-24 ENCOUNTER — Other Ambulatory Visit: Payer: Self-pay | Admitting: Neurology

## 2024-03-24 DIAGNOSIS — R413 Other amnesia: Secondary | ICD-10-CM

## 2024-03-28 ENCOUNTER — Ambulatory Visit (HOSPITAL_COMMUNITY): Payer: MEDICAID | Admitting: Licensed Clinical Social Worker

## 2024-04-03 ENCOUNTER — Ambulatory Visit
Admission: RE | Admit: 2024-04-03 | Discharge: 2024-04-03 | Disposition: A | Discharge status: Home / Self Care | Payer: MEDICAID | Source: Ambulatory Visit | Attending: Neurology | Admitting: Neurology

## 2024-04-03 ENCOUNTER — Ambulatory Visit (INDEPENDENT_AMBULATORY_CARE_PROVIDER_SITE_OTHER): Payer: MEDICAID | Admitting: Licensed Clinical Social Worker

## 2024-04-03 DIAGNOSIS — R413 Other amnesia: Secondary | ICD-10-CM | POA: Insufficient documentation

## 2024-04-03 DIAGNOSIS — F331 Major depressive disorder, recurrent, moderate: Secondary | ICD-10-CM

## 2024-04-03 NOTE — Progress Notes (Signed)
 THERAPIST PROGRESS NOTE   Session Date: 04/03/2024  Session Time: 0911 - 1008 Virtual Visit via Video Note  I connected with Lynn [name] on 04/03/24 at  9:00 AM EST by a video enabled telemedicine application and verified that I am speaking with the correct person using two identifiers.  Location: Patient: In parked car, outside scheduled MRI Appt. Provider: Home Office   I discussed the limitations of evaluation and management by telemedicine and the availability of in person appointments. The patient expressed understanding and agreed to proceed.  I discussed the assessment and treatment plan with the patient. The patient was provided an opportunity to ask questions and all were answered. The patient agreed with the plan and demonstrated an understanding of the instructions.   The patient was advised to call back or seek an in-person evaluation if the symptoms worsen or if the condition fails to improve as anticipated.  I provided 57 minutes of non-face-to-face time during this encounter.  Participation Level: Active  Behavioral Response: CasualAlertEuthymic  Type of Therapy: Individual Therapy  Treatment Goals addressed:  Progressing (2) LTG: Increase coping skills to manage depression and improve ability to perform daily activities (OP Depression) STG: Lynn [name] will identify cognitive patterns and beliefs that support depression (OP Depression)  Initial (1) LTG: Get me to the gym; Increase physical activity to support improvements in physical health and improvements in moods, AEB securing physical trainer and attending the gym at least 2x/week. (OP Depression)  Completed/Met (4) LTG: Reduce frequency, intensity, and duration of depression symptoms so that daily functioning is improved (OP Depression) STG: Reduce overall depression score by a minimum of 25% on the Patient Health Questionnaire (PHQ-9) (OP Depression) LTG: Lynn [name] will score less than 5 on the Generalized  Anxiety Disorder 7 Scale (GAD-7) (Anxiety) STG: Report a decrease in anxiety symptoms as evidenced by an overall reduction in anxiety score by a minimum of 25% on the Generalized Anxiety Disorder Scale (GAD-7) (Anxiety)  ProgressTowards Goals: Progressing  Interventions: CBT, Motivational Interviewing, and Supportive  Summary: Lynn [name] is a 63 year old African-American female who presents for follow-up therapy appointment in the continued management of depressive symptoms.  Patient engaged in session, presenting in overall pleasant moods, and congruent affect throughout. Pt engaged in introductory check-in, sharing It's going, further stating Let me tell you this! God is good to me!, sharing of having learned from Neurology of there being no presence of APOE gene that would be indicative of Alzheimer's, however finding of B12 to be significantly low, and attributing all experienced sxs to B12 levels and future course of tx to manage such. Explored realistic expectations and thoughts in relation to managing B12 and expectations for addressing all areas of concern of which pt in experiencing, and attributing all areas of concern in relation to physical and mental and emotional sxs to B12 deficiency. Reassessed presenting depressive sxs via PHQ-9, noting of increase in sxs scores, attributing challenges to B12 deficiency, proving optimistic that sxs will reduce with improved mgmnt of B12. Pt briefly detailed presenting stress in relation to neighbor, and intent to further address concerns with housing board.     02/03/2024    2:51 PM 12/20/2023   10:08 AM 12/06/2023   10:57 AM 09/28/2023    2:36 PM 08/30/2023    2:15 PM  Depression screen PHQ 2/9  Decreased Interest 0 0 0 0 1  Down, Depressed, Hopeless 0 0 0 0 1  PHQ - 2 Score 0 0 0 0 2  Altered sleeping  0 0 0 1 1  Tired, decreased energy 0 0 0 0 1  Change in appetite 3 3 1  0 2  Feeling bad or failure about yourself  0 0 0 0 0  Trouble  concentrating 0 0 0 0 0  Moving slowly or fidgety/restless 0 0 0 0 0  Suicidal thoughts 0 0 0 0 0  PHQ-9 Score 3 3 1 1 6   Difficult doing work/chores Not difficult at all Not difficult at all Not difficult at all Not difficult at all Not difficult at all    Suicidal/Homicidal: No  Therapist Response:  Clinician openly greeted patient upon joining today's visit, assessing presenting moods and affect, prompting pt's details of events and observed moods. Further engaged pt, utilizing open ended questions in eliciting recounts of events of the past two weeks, presence of new and ongoing stressors, observed implications on moods, and individual attempts in navigating challenges. Utilized active listening techniques to provide support and validation of pt's recounts of events, further supporting pt in processing thoughts and feelings as it relates to presenting stressors and progressions, and supporting pt in exploring rational and irrational thoughts and identifying more rational thoughts.  Utilized CBT, psycho-ed, and supportive reflection interventions in aiding pt in processing thoughts and feelings, and navigate recent events. Clinician reassessed severity of presenting sxs, and presence of any safety concerns.  [x]  Cognitive Challenging [x]  Cognitive Refocusing [x]  Cognitive Reframing  []  Communication Skills []  Compliance Issues []  DBT []  Exploration of Coping Patterns [x]  Exploration of Emotions [x]  Exploration of Relationship Patterns []  Guided Imagery []  Interactive Feedback []  Interpersonal Resolutions []  Mindfulness Training []  Preventative Services [x]  Psycho-Education  []  Relaxation/Deep Breathing [x]  Review of Treatment Plan/Progress []  Role-Play/Behavioral Rehearsal  [x]  Structured Problem Solving [x]  Supportive Reflection []  Symptom Management  []  Other   Patient responded well to interventions. Patient continues to meet criteria for MDD. Patient will continue to benefit from  engagement in outpatient therapy due to being the least restrictive service to meet presenting needs. Pt proves to be maintaining progress towards identified goals.  Homework:  None.  Plan: Return again in 2 weeks.   Diagnosis:  Encounter Diagnosis  Name Primary?   Moderate episode of recurrent major depressive disorder (HCC) Yes     Collaboration of Care: Psychiatrist AEB provider documentation available in EHR.  Patient/Guardian was advised Release of Information must be obtained prior to any record release in order to collaborate their care with an outside provider. Patient/Guardian was advised if they have not already done so to contact the registration department to sign all necessary forms in order for us  to release information regarding their care.   Consent: Patient/Guardian gives verbal consent for treatment and assignment of benefits for services provided during this visit. Patient/Guardian expressed understanding and agreed to proceed.   [name], MSW, LCSW 04/03/2024,  9:13 AM

## 2024-04-17 ENCOUNTER — Ambulatory Visit (INDEPENDENT_AMBULATORY_CARE_PROVIDER_SITE_OTHER): Payer: MEDICAID | Admitting: Licensed Clinical Social Worker

## 2024-04-17 DIAGNOSIS — F331 Major depressive disorder, recurrent, moderate: Secondary | ICD-10-CM | POA: Diagnosis not present

## 2024-04-17 NOTE — Progress Notes (Signed)
 THERAPIST PROGRESS NOTE   Session Date: 04/17/2024  Session Time: 0909 - 1007 Virtual Visit via Video Note  I connected with Lynn [name] on 04/17/24 at  9:00 AM EST by a video enabled telemedicine application and verified that I am speaking with the correct person using two identifiers.  Location: Patient: Lynn [name] visiting cousin. Provider: Home Office   I discussed the limitations of evaluation and management by telemedicine and the availability of in person appointments. The patient expressed understanding and agreed to proceed.  I discussed the assessment and treatment plan with the patient. The patient was provided an opportunity to ask questions and all were answered. The patient agreed with the plan and demonstrated an understanding of the instructions.   The patient was advised to call back or seek an in-person evaluation if the symptoms worsen or if the condition fails to improve as anticipated.  I provided 58 minutes of non-face-to-face time during this encounter.  Participation Level: Active  Behavioral Response: CasualAlertAnxious, Depressed, and Euthymic  Type of Therapy: Individual Therapy  Treatment Goals addressed:  Progressing (2) LTG: Increase coping skills to manage depression and improve ability to perform daily activities (OP Depression) STG: Ami will identify cognitive patterns and beliefs that support depression (OP Depression)  Initial (1) LTG: Get me to the gym; Increase physical activity to support improvements in physical health and improvements in moods, AEB securing physical trainer and attending the gym at least 2x/week. (OP Depression)  Completed/Met (4) LTG: Reduce frequency, intensity, and duration of depression symptoms so that daily functioning is improved (OP Depression) STG: Reduce overall depression score by a minimum of 25% on the Patient Health Questionnaire (PHQ-9) (OP Depression) LTG: Lynn [name] will score less than 5 on the  Generalized Anxiety Disorder 7 Scale (GAD-7) (Anxiety) STG: Report a decrease in anxiety symptoms as evidenced by an overall reduction in anxiety score by a minimum of 25% on the Generalized Anxiety Disorder Scale (GAD-7) (Anxiety)  ProgressTowards Goals: Progressing  Interventions: CBT, Motivational Interviewing, and Supportive  Summary: Lynn [name] is a 63 year old African-American female who presents for follow-up therapy appointment in the continued management of depressive symptoms.  Patient engaged in session, presenting in depressed moods, and congruent affect throughout. Pt engaged in introductory check-in, sharing of being at cousin's home in [place], visiting for the weekend and having attended church yesterday. Pt further shared of having been reaching out to pastor, calling, texting, over recent weeks and not receiving any response, having started having thoughts and feelings of leaving the church and feeling unsupported, deciding to travel to [place] to connect with pastor. Pt detailed of having resolved concerns with pastor, learning of him receiving 400+ texts daily from other churches, church members, and members of the clergy, with pt further expressing understanding of pastors workload proving a barrier to connect via text, further sharing of having received support and prayer with pt surrounding presenting medical complications.  Pt shared of experiencing increased stress surrounding learning of MRI findings of concerns as it relates to white matter and potential blockages needing further exploration with medical provider in future visits.  Pt engaged in exploring who else she may prove able to connect with in order to obtain spiritual support when needing guidance, sharing of not feeling very trusting of others, and feeling cautious of sharing personal information with others due to prior experiences of personal information being shared.      04/03/2024    9:52 AM  02/03/2024    2:51 PM 12/20/2023  10:08 AM 12/06/2023   10:57 AM 09/28/2023    2:36 PM  Depression screen PHQ 2/9  Decreased Interest 0 0 0 0 0  Down, Depressed, Hopeless 0 0 0 0 0  PHQ - 2 Score 0 0 0 0 0  Altered sleeping 1 0 0 0 1  Tired, decreased energy 1 0 0 0 0  Change in appetite 3 3 3 1  0  Feeling bad or failure about yourself  0 0 0 0 0  Trouble concentrating 3 0 0 0 0  Moving slowly or fidgety/restless 0 0 0 0 0  Suicidal thoughts 0 0 0 0 0  PHQ-9 Score 8  3  3  1  1    Difficult doing work/chores Not difficult at all Not difficult at all Not difficult at all Not difficult at all Not difficult at all     Data saved with a previous flowsheet row definition    Suicidal/Homicidal: No  Therapist Response:  Clinician openly greeted patient upon joining today's visit, assessing presenting moods and affect, prompting pt's details of events and observed moods. Utilized open ended questions in eliciting recounts of events of the past two weeks since prior visit, exploring presence of new and ongoing stressors in relation to medical concerns and recent findings, observed implications on moods, and individual attempts in navigating challenges. Utilized active listening techniques to provide support and validation of pt's processing thoughts and feelings as it relates to presenting medical stressors and challenges securing ample support, and supporting pt in exploring alternate individuals pt may prove to seek support from. Utilized CBT, psycho-ed, and supportive reflection interventions in aiding pt in processing thoughts and feelings, and navigate recent events. Clinician reassessed severity of presenting sxs, and presence of any safety concerns.  [x]  Cognitive Challenging [x]  Cognitive Refocusing [x]  Cognitive Reframing  []  Communication Skills []  Compliance Issues []  DBT [x]  Exploration of Coping Patterns [x]  Exploration of Emotions [x]  Exploration of Relationship Patterns []  Guided  Imagery []  Interactive Feedback []  Interpersonal Resolutions []  Mindfulness Training []  Preventative Services [x]  Psycho-Education  []  Relaxation/Deep Breathing []  Review of Treatment Plan/Progress []  Role-Play/Behavioral Rehearsal  [x]  Structured Problem Solving [x]  Supportive Reflection []  Symptom Management  []  Other   Patient responded well to interventions. Patient continues to meet criteria for MDD. Patient will continue to benefit from engagement in outpatient therapy due to being the least restrictive service to meet presenting needs. Pt proves to be maintaining progress towards identified goals.  Homework:  None.  Plan: Return again in 2 weeks.   Diagnosis:  Encounter Diagnosis  Name Primary?   Moderate episode of recurrent major depressive disorder (HCC) Yes    Collaboration of Care: Psychiatrist AEB provider documentation available in EHR.  Patient/Guardian was advised Release of Information must be obtained prior to any record release in order to collaborate their care with an outside provider. Patient/Guardian was advised if they have not already done so to contact the registration department to sign all necessary forms in order for us  to release information regarding their care.   Consent: Patient/Guardian gives verbal consent for treatment and assignment of benefits for services provided during this visit. Patient/Guardian expressed understanding and agreed to proceed.   [name], MSW, LCSW 04/17/2024,  9:14 AM

## 2024-05-01 ENCOUNTER — Ambulatory Visit (INDEPENDENT_AMBULATORY_CARE_PROVIDER_SITE_OTHER): Payer: MEDICAID | Admitting: Licensed Clinical Social Worker

## 2024-05-01 DIAGNOSIS — F4312 Post-traumatic stress disorder, chronic: Secondary | ICD-10-CM | POA: Diagnosis not present

## 2024-05-01 DIAGNOSIS — F331 Major depressive disorder, recurrent, moderate: Secondary | ICD-10-CM

## 2024-05-01 NOTE — Progress Notes (Signed)
 THERAPIST PROGRESS NOTE   Session Date: 05/01/2024  Session Time: 0803 - 0856 Virtual Visit via Video Note  I connected with Lynn [name] on 05/01/24 at  8:00 AM EST by a video enabled telemedicine application and verified that I am speaking with the correct person using two identifiers.  Location: Patient: Home Provider: Home Office   I discussed the limitations of evaluation and management by telemedicine and the availability of in person appointments. The patient expressed understanding and agreed to proceed.  I discussed the assessment and treatment plan with the patient. The patient was provided an opportunity to ask questions and all were answered. The patient agreed with the plan and demonstrated an understanding of the instructions.   The patient was advised to call back or seek an in-person evaluation if the symptoms worsen or if the condition fails to improve as anticipated.  I provided 53 minutes of non-face-to-face time during this encounter.  Participation Level: Active  Behavioral Response: CasualAlertAnxious, Depressed, and Euthymic  Type of Therapy: Individual Therapy  Treatment Goals addressed:  Progressing (2) LTG: Increase coping skills to manage depression and improve ability to perform daily activities (OP Depression) STG: Lynn [name] will identify cognitive patterns and beliefs that support depression (OP Depression)  Initial (1) LTG: Get me to the gym; Increase physical activity to support improvements in physical health and improvements in moods, AEB securing physical trainer and attending the gym at least 2x/week. (OP Depression)  Completed/Met (4) LTG: Reduce frequency, intensity, and duration of depression symptoms so that daily functioning is improved (OP Depression) STG: Reduce overall depression score by a minimum of 25% on the Patient Health Questionnaire (PHQ-9) (OP Depression) LTG: Lynn [name] will score less than 5 on the Generalized Anxiety Disorder  7 Scale (GAD-7) (Anxiety) STG: Report a decrease in anxiety symptoms as evidenced by an overall reduction in anxiety score by a minimum of 25% on the Generalized Anxiety Disorder Scale (GAD-7) (Anxiety)  ProgressTowards Goals: Progressing  Interventions: CBT, Motivational Interviewing, and Supportive  Summary: Lynn [name] is a 63 year old African-American female who presents for follow-up therapy appointment in the continued management of depressive symptoms.  Patient joined scheduled virtual visit from her bedroom. Lights were off and curtains closed; patient appeared reluctant to turn on the light and stated she did not wish to do so, however proving to do so.  Patient presented in depressed moods and congruent affect, reporting, "It's not going," during check-in. She described recent interpersonal stressors with her daughter during the Thanksgiving holiday. Patient planned to stay at her daughter's home and assist with cooking. Conflict occurred when patient's grandson asked her daughter to watch his two young children (under age 41), and daughter declined but volunteered patient to do so. Patient also reported discomfort with her daughter's home environment, noting she kept her coat on due to the cold temperature inside the house.  Patient described additional stressors while caring for her great-grandchildren the following day. She brought the children to her home and became frustrated while attempting to get them settled inside and then returning to unload the car. She stated, "I was really mad, I'm still pissed off."  Patient reported later spending time with grandchildren and discussing their concerns about the way her daughter speaks to patient. She noted they shared dislike for how patient is treated.  Patient stated her daughter contacted her afterward, resulting in further conflict, sharing of having deciding to remove daughter from all POA and points of contact related to personal  affairs. She discussed  this with her son, who abruptly left the conversation, later returning after needing to "calm down." Patient reported discontinuing the discussion due to believing he was not responsible enough to continue.  Patient reported not addressing concerns directly with her daughter, stating she is "a Curator" and does not wish to argue. She reported deciding not to attend Christmas family gatherings and plans instead to remain home, anticipating possible visits from her son and grandson, and stated she feels she will be fine spending the holiday at home.      04/03/2024    9:52 AM 02/03/2024    2:51 PM 12/20/2023   10:08 AM 12/06/2023   10:57 AM 09/28/2023    2:36 PM  Depression screen PHQ 2/9  Decreased Interest 0 0 0 0 0  Down, Depressed, Hopeless 0 0 0 0 0  PHQ - 2 Score 0 0 0 0 0  Altered sleeping 1 0 0 0 1  Tired, decreased energy 1 0 0 0 0  Change in appetite 3 3 3 1  0  Feeling bad or failure about yourself  0 0 0 0 0  Trouble concentrating 3 0 0 0 0  Moving slowly or fidgety/restless 0 0 0 0 0  Suicidal thoughts 0 0 0 0 0  PHQ-9 Score 8  3  3  1  1    Difficult doing work/chores Not difficult at all Not difficult at all Not difficult at all Not difficult at all Not difficult at all     Data saved with a previous flowsheet row definition    Suicidal/Homicidal: No  Therapist Response:  Clinician openly greeted patient upon joining today's visit, assessing presenting moods and affect, prompting pt's engagement in introductory check-in and exploration of presenting moods. Utilized open ended questions in eliciting recounts of events of the past two weeks, exploring newly identified challenges experienced over recent holiday, and ongoing stressors in relation to negative interactions with adult daughter. Utilized active listening techniques to provide support and validation of pt's processing thoughts and feelings surrounding efforts towards navigating challenging  interactions. CBT interventions, motivational interviewing, strengths-based approaches, psychoeducation, and supportive reflection used to explore stressors, emotional responses, boundaries, and options for addressing family conflict. Clinician reassessed severity of presenting sxs, and presence of any safety concerns.  [x]  Cognitive Challenging []  Cognitive Refocusing [x]  Cognitive Reframing  []  Communication Skills []  Compliance Issues []  DBT [x]  Exploration of Coping Patterns [x]  Exploration of Emotions [x]  Exploration of Relationship Patterns []  Guided Imagery []  Interactive Feedback [x]  Interpersonal Resolutions []  Mindfulness Training []  Preventative Services [x]  Psycho-Education  []  Relaxation/Deep Breathing []  Review of Treatment Plan/Progress []  Role-Play/Behavioral Rehearsal  [x]  Structured Problem Solving [x]  Supportive Reflection []  Symptom Management  []  Other   Patient responded well to interventions. Patient continues to meet criteria for MDD. Patient will continue to benefit from engagement in outpatient therapy due to being the least restrictive service to meet presenting needs. Pt proves to be maintaining progress towards identified goals.  Homework:  None.  Plan: Return again in 2 weeks.   Diagnosis:  Encounter Diagnoses  Name Primary?   Moderate episode of recurrent major depressive disorder (HCC) Yes   Chronic post-traumatic stress disorder (PTSD)      Collaboration of Care: Psychiatrist AEB provider documentation available in EHR.  Patient/Guardian was advised Release of Information must be obtained prior to any record release in order to collaborate their care with an outside provider. Patient/Guardian was advised if they have not already done so to contact  the registration department to sign all necessary forms in order for us  to release information regarding their care.   Consent: Patient/Guardian gives verbal consent for treatment and assignment of benefits  for services provided during this visit. Patient/Guardian expressed understanding and agreed to proceed.   Lynn [name], MSW, LCSW 05/01/2024,  8:06 AM

## 2024-05-15 ENCOUNTER — Ambulatory Visit (INDEPENDENT_AMBULATORY_CARE_PROVIDER_SITE_OTHER): Payer: MEDICAID | Admitting: Licensed Clinical Social Worker

## 2024-05-15 DIAGNOSIS — F331 Major depressive disorder, recurrent, moderate: Secondary | ICD-10-CM

## 2024-05-15 DIAGNOSIS — F4312 Post-traumatic stress disorder, chronic: Secondary | ICD-10-CM
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
## 2024-05-18 ENCOUNTER — Ambulatory Visit (HOSPITAL_COMMUNITY): Payer: MEDICAID | Admitting: Psychiatry

## 2024-05-29 ENCOUNTER — Ambulatory Visit (HOSPITAL_COMMUNITY): Payer: MEDICAID | Admitting: Licensed Clinical Social Worker

## 2024-05-29 DIAGNOSIS — F331 Major depressive disorder, recurrent, moderate: Secondary | ICD-10-CM | POA: Diagnosis not present
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
## 2024-06-06 ENCOUNTER — Telehealth (HOSPITAL_COMMUNITY): Payer: MEDICAID | Admitting: Psychiatry

## 2024-06-07 ENCOUNTER — Other Ambulatory Visit (HOSPITAL_COMMUNITY): Payer: Self-pay | Admitting: Psychiatry

## 2024-06-07 DIAGNOSIS — F4312 Post-traumatic stress disorder, chronic: Secondary | ICD-10-CM

## 2024-06-07 DIAGNOSIS — F331 Major depressive disorder, recurrent, moderate: Secondary | ICD-10-CM

## 2024-06-08 ENCOUNTER — Telehealth (HOSPITAL_COMMUNITY): Payer: MEDICAID | Admitting: Psychiatry

## 2024-06-08 ENCOUNTER — Encounter (HOSPITAL_COMMUNITY): Payer: Self-pay | Admitting: Psychiatry

## 2024-06-08 VITALS — Wt 224.0 lb

## 2024-06-08 DIAGNOSIS — F331 Major depressive disorder, recurrent, moderate: Secondary | ICD-10-CM

## 2024-06-08 DIAGNOSIS — F4312 Post-traumatic stress disorder, chronic: Secondary | ICD-10-CM

## 2024-06-08 DIAGNOSIS — R413 Other amnesia: Secondary | ICD-10-CM

## 2024-06-08 MED ORDER — LURASIDONE HCL 40 MG PO TABS: 40.0000 mg | ORAL_TABLET | Freq: Every day | ORAL | 2 refills | Status: AC

## 2024-06-08 MED ORDER — FLUOXETINE HCL 20 MG PO CAPS: 20.0000 mg | ORAL_CAPSULE | Freq: Every day | ORAL | 2 refills | Status: AC
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
## 2024-06-12 ENCOUNTER — Ambulatory Visit (INDEPENDENT_AMBULATORY_CARE_PROVIDER_SITE_OTHER): Payer: MEDICAID | Admitting: Licensed Clinical Social Worker

## 2024-06-12 DIAGNOSIS — F331 Major depressive disorder, recurrent, moderate: Secondary | ICD-10-CM

## 2024-06-12 DIAGNOSIS — F4312 Post-traumatic stress disorder, chronic: Secondary | ICD-10-CM
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
## 2024-06-21 NOTE — Progress Notes (Signed)
 [facility] Management Contact Note    Care Manager/Extender called member for the purposes of comprehensive care management (assessment, care plan, chronic care management). Member was not available, this Clinical research associate left message requesting callback..    POS: This encounter was generated from the following place of service: 11 OFFICE .           Ladawna Walgren B Kao Berkheimer, LCSW

## 2024-06-26 ENCOUNTER — Ambulatory Visit (INDEPENDENT_AMBULATORY_CARE_PROVIDER_SITE_OTHER): Payer: MEDICAID | Admitting: Licensed Clinical Social Worker

## 2024-06-26 DIAGNOSIS — F4312 Post-traumatic stress disorder, chronic: Secondary | ICD-10-CM | POA: Diagnosis not present

## 2024-06-26 DIAGNOSIS — F331 Major depressive disorder, recurrent, moderate: Secondary | ICD-10-CM
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
## 2024-07-10 ENCOUNTER — Ambulatory Visit (HOSPITAL_COMMUNITY): Payer: MEDICAID | Admitting: Licensed Clinical Social Worker

## 2024-07-25 ENCOUNTER — Ambulatory Visit (HOSPITAL_COMMUNITY): Payer: MEDICAID | Admitting: Licensed Clinical Social Worker

## 2024-08-07 ENCOUNTER — Ambulatory Visit (HOSPITAL_COMMUNITY): Payer: MEDICAID | Admitting: Licensed Clinical Social Worker

## 2024-08-21 ENCOUNTER — Ambulatory Visit (HOSPITAL_COMMUNITY): Payer: MEDICAID | Admitting: Licensed Clinical Social Worker

## 2024-09-06 ENCOUNTER — Telehealth (HOSPITAL_COMMUNITY): Payer: MEDICAID | Admitting: Psychiatry
